# Patient Record
Sex: Male | Born: 1950 | Race: White | Hispanic: No | Smoking: Current every day smoker
Health system: Southern US, Community
[De-identification: ages and names within clinical notes are randomized; demographics above are authoritative.]

## PROBLEM LIST (undated history)

## (undated) DIAGNOSIS — Z789 Other specified health status: Secondary | ICD-10-CM

## (undated) HISTORY — PX: OTHER SURGICAL HISTORY: SHX169

## (undated) HISTORY — PX: KNEE SURGERY: SHX244

## (undated) HISTORY — PX: LUNG SURGERY: SHX703

## (undated) HISTORY — PX: APPENDECTOMY: SHX54

---

## 2000-01-22 ENCOUNTER — Emergency Department (HOSPITAL_COMMUNITY): Admission: EM | Admit: 2000-01-22 | Discharge: 2000-01-22 | Payer: Self-pay | Admitting: Emergency Medicine

## 2000-02-01 ENCOUNTER — Emergency Department (HOSPITAL_COMMUNITY): Admission: EM | Admit: 2000-02-01 | Discharge: 2000-02-01 | Payer: Self-pay

## 2000-09-15 ENCOUNTER — Other Ambulatory Visit: Admission: RE | Admit: 2000-09-15 | Discharge: 2000-09-15 | Payer: Self-pay | Admitting: Orthopedic Surgery

## 2001-12-02 ENCOUNTER — Encounter: Payer: Self-pay | Admitting: Emergency Medicine

## 2001-12-02 ENCOUNTER — Emergency Department (HOSPITAL_COMMUNITY): Admission: AC | Admit: 2001-12-02 | Discharge: 2001-12-03 | Payer: Self-pay

## 2001-12-03 ENCOUNTER — Encounter: Payer: Self-pay | Admitting: Emergency Medicine

## 2011-06-20 ENCOUNTER — Emergency Department (HOSPITAL_COMMUNITY)
Admission: EM | Admit: 2011-06-20 | Discharge: 2011-06-20 | Disposition: A | Discharge status: Home / Self Care | Payer: Worker's Compensation | Attending: Emergency Medicine | Admitting: Emergency Medicine

## 2011-06-20 ENCOUNTER — Encounter (HOSPITAL_COMMUNITY): Payer: Self-pay | Admitting: *Deleted

## 2011-06-20 ENCOUNTER — Emergency Department (HOSPITAL_COMMUNITY): Payer: Worker's Compensation

## 2011-06-20 DIAGNOSIS — M25469 Effusion, unspecified knee: Secondary | ICD-10-CM | POA: Insufficient documentation

## 2011-06-20 DIAGNOSIS — Z9889 Other specified postprocedural states: Secondary | ICD-10-CM | POA: Insufficient documentation

## 2011-06-20 DIAGNOSIS — IMO0002 Reserved for concepts with insufficient information to code with codable children: Secondary | ICD-10-CM | POA: Insufficient documentation

## 2011-06-20 DIAGNOSIS — M25569 Pain in unspecified knee: Secondary | ICD-10-CM | POA: Insufficient documentation

## 2011-06-20 DIAGNOSIS — X500XXA Overexertion from strenuous movement or load, initial encounter: Secondary | ICD-10-CM | POA: Insufficient documentation

## 2011-06-20 DIAGNOSIS — S8391XA Sprain of unspecified site of right knee, initial encounter: Secondary | ICD-10-CM

## 2011-06-20 DIAGNOSIS — Y990 Civilian activity done for income or pay: Secondary | ICD-10-CM | POA: Insufficient documentation

## 2011-06-20 MED ORDER — MELOXICAM 15 MG PO TABS: 15.0000 mg | ORAL_TABLET | Freq: Every day | ORAL | Status: AC

## 2011-06-20 NOTE — ED Notes (Signed)
Patient was working at [employer]. A floor board slipped and patient twisted right knee. Did not fall to floor

## 2011-06-20 NOTE — ED Provider Notes (Signed)
History     CSN: [id number]  Arrival date & time 06/20/11  1025   First MD Initiated Contact with Patient 06/20/11 1039      Chief Complaint  Patient presents with  . Knee Pain    right knee    (Consider location/radiation/quality/duration/timing/severity/associated sxs/prior treatment) Patient is a 61 y.o. male presenting with knee pain. The history is provided by the patient.  Knee Pain This is a new problem. The current episode started today. The problem occurs constantly. Associated symptoms include joint swelling. Pertinent negatives include no chills or fever.  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 State was working today and a board slipped from underneath his foot. States it made his right knee twist. He denies falling or hitting his knee on the ground. States unable to bear weight on that leg now. History of right knee orthoscopic surgeries. Denies weakness or numbness distal to the injury. No other injuries.   History reviewed. No pertinent past medical history.  Past Surgical History  Procedure Date  . Knee surgery     multiple right knee surgeries from prior injury    No family history on file.  History  Substance Use Topics  . Smoking status: Not on file  . Smokeless tobacco: Not on file  . Alcohol Use:       Review of Systems  Constitutional: Negative for fever and chills.  HENT: Negative.   Eyes: Negative.   Respiratory: Negative.   Cardiovascular: Negative.   Gastrointestinal: Negative.   Genitourinary: Negative.   Musculoskeletal: Positive for joint swelling.  Skin: Negative.   Neurological: Negative.   Psychiatric/Behavioral: Negative.     Allergies  Review of patient's allergies indicates not on file.  Home Medications  No current outpatient prescriptions on file.  BP 150/79  Pulse 87  Temp(Src) 98.3 F (36.8 C) (Oral)  Resp 18  Ht 5\' 11"  (1.803 m)  Wt 220 lb (99.791 kg)  BMI 30.68 kg/m2  SpO2 100%  Physical Exam  Nursing  note and vitals reviewed. Constitutional: He is oriented to person, place, and time. He appears well-developed and well-nourished. No distress.  HENT:  Head: Normocephalic.  Neck: Neck supple.  Cardiovascular: Normal rate, regular rhythm and normal heart sounds.   Pulmonary/Chest: Effort normal and breath sounds normal.  Musculoskeletal: Normal range of motion.       Right knee normal appearing with no significant swelling noted. No tenderness with palpation over entire joint. Patella tendon intact. Full ROM of the joint, however, pain with full flexion and full extension. Negative anterior and posterior drawer signs. No laxaty or pain with medial or lateral stress. Positive appley's test. NOrmal right hip and ankle  Neurological: He is alert and oriented to person, place, and time.  Skin: Skin is warm and dry.  Psychiatric: He has a normal mood and affect.    ED Course  Procedures (including critical care time)  No results found for this or any previous visit. Dg Knee Complete 4 Views Right  06/20/2011  *RADIOLOGY REPORT*  Clinical Data: Pain after twisting injury.  RIGHT KNEE - COMPLETE 4+ VIEW  Comparison: None.  Findings: Small marginal spurs about all three compartments of the knee.  There is mild narrowing of the articular cartilage in the medial compartment.  Normal alignment and mineralization.  Negative for fracture, dislocation, or other acute bone injury.  No definite effusion.  IMPRESSION: 1.  Early tricompartmental degenerative changes, most marked medially. 2.  No fracture or other acute  bony abnormality.  Original Report Authenticated By: Osa Craver, M.D.    On the job injury. Right knee appears normal. X-ray just shows arthritis. Joint is stable. Pt however unable to put any weight on that leg. Refusing pain medications. Will apply immobilizer, crutches provided. Will d/c home with orthopedics follow up. Instructed not to put any weight on that leg until seen by  ortho.    No diagnosis found.    MDM          Lottie Mussel, PA 06/20/11 1228

## 2011-06-20 NOTE — Discharge Instructions (Signed)
Your x-ray shows severe arthritis in that right knee. There is no other acute findings. Keep your knee elevated at home, Ice it several times a day. Use crutches and immobilizer when walking around. Start mobic for pain and inflamation as prescribed. Follow up with orthopedics specialist for further evaluation and treatment of your knee. No working, kneeling, squatting, jumping or running until cleared by orthopedics or you are completely pain free.  Knee Sprain You have a knee sprain. Sprains are painful injuries to the joints. A sprain is a partial or complete tearing of ligaments. Ligaments are tough, fibrous tissues that hold bones together at the joints. A strain (sprain) has occurred when a ligament is stretched or damaged. This injury may take several weeks to heal. This is often the same length of time as a bone fracture (break in bone) takes to heal. Even though a fracture (bone break) may not have occurred, the recovery times may be similar. HOME CARE INSTRUCTIONS   Rest the injured area for as long as directed by your caregiver. Then slowly start using the joint as directed by your caregiver and as the pain allows. Use crutches as directed. If the knee was splinted or casted, continue use and care as directed. If an ace bandage has been applied today, it should be removed and reapplied every 3 to 4 hours. It should not be applied tightly, but firmly enough to keep swelling down. Watch toes and feet for swelling, bluish discoloration, coldness, numbness or excessive pain. If any of these symptoms occur, remove the ace bandage and reapply more loosely.If these symptoms persist, seek medical attention.   For the first 24 hours, lie down. Keep the injured extremity elevated on two pillows.   Apply ice to the injured area for 15 to 20 minutes every couple hours. Repeat this 3 to 4 times per day for the first 48 hours. Put the ice in a plastic bag and place a towel between the bag of ice and your  skin.   Wear any splinting, casting, or elastic bandage applications as instructed.   Only take over-the-counter or prescription medicines for pain, discomfort, or fever as directed by your caregiver. Do not use aspirin immediately after the injury unless instructed by your caregiver. Aspirin can cause increased bleeding and bruising of the tissues.   If you were given crutches, continue to use them as instructed. Do not resume weight bearing on the affected extremity until instructed.  Persistent pain and inability to use the injured area as directed for more than 2 to 3 days are warning signs. If this happens you should see a caregiver for a follow-up visit as soon as possible. Initially, a hairline fracture (this is the same as a broken bone) may not be evident on x-rays. Persistent pain and swelling indicate that further evaluation, non-weight bearing (use of crutches as instructed), and/or further x-rays are indicated. X-rays may sometimes not show a small fracture until a week or ten days later. Make a follow-up appointment with your own caregiver or one to whom we have referred you. A radiologist (specialist in reading x-rays) may re-read your X-rays. Make sure you know how you are to get your x-ray results. Do not assume everything is normal if you do not hear from [place]. SEEK MEDICAL CARE IF:   Bruising, swelling, or pain increases.   You have cold or numb toes   You have continuing difficulty or pain with walking.  SEEK IMMEDIATE MEDICAL CARE IF:  Your toes are cold, numb or blue.   The pain is not responding to medications and continues to stay the same or get worse.  MAKE SURE YOU:   Understand these instructions.   Will watch your condition.   Will get help right away if you are not doing well or get worse.  Document Released: 04/19/2005 Document Revised: 12/30/2010 Document Reviewed: 04/03/2007 [facility] Patient Information 2012 [place], [place].

## 2011-06-20 NOTE — ED Notes (Signed)
Awaiting lab to come to draw blood for Wk Comp injury

## 2011-06-21 NOTE — ED Provider Notes (Signed)
Medical screening examination/treatment/procedure(s) were performed by non-physician practitioner and as supervising physician I was immediately available for consultation/collaboration.  Hurman Horn, MD 06/21/11 2158

## 2013-11-22 ENCOUNTER — Emergency Department (HOSPITAL_COMMUNITY)
Admission: EM | Admit: 2013-11-22 | Discharge: 2013-11-23 | Disposition: A | Discharge status: Home / Self Care | Payer: Worker's Compensation | Attending: Emergency Medicine | Admitting: Emergency Medicine

## 2013-11-22 DIAGNOSIS — W06XXXA Fall from bed, initial encounter: Secondary | ICD-10-CM | POA: Insufficient documentation

## 2013-11-22 DIAGNOSIS — Y92009 Unspecified place in unspecified non-institutional (private) residence as the place of occurrence of the external cause: Secondary | ICD-10-CM | POA: Insufficient documentation

## 2013-11-22 DIAGNOSIS — Y9389 Activity, other specified: Secondary | ICD-10-CM | POA: Insufficient documentation

## 2013-11-22 DIAGNOSIS — R29818 Other symptoms and signs involving the nervous system: Secondary | ICD-10-CM | POA: Insufficient documentation

## 2013-11-22 DIAGNOSIS — F17200 Nicotine dependence, unspecified, uncomplicated: Secondary | ICD-10-CM | POA: Insufficient documentation

## 2013-11-22 DIAGNOSIS — W19XXXA Unspecified fall, initial encounter: Secondary | ICD-10-CM

## 2013-11-22 DIAGNOSIS — S0990XA Unspecified injury of head, initial encounter: Secondary | ICD-10-CM | POA: Insufficient documentation

## 2013-11-23 ENCOUNTER — Emergency Department (HOSPITAL_COMMUNITY): Payer: Worker's Compensation

## 2013-11-23 ENCOUNTER — Encounter (HOSPITAL_COMMUNITY): Payer: Self-pay | Admitting: Emergency Medicine

## 2013-11-23 ENCOUNTER — Emergency Department (HOSPITAL_COMMUNITY): Payer: Self-pay

## 2013-11-23 LAB — COMPREHENSIVE METABOLIC PANEL
ALT: 20 U/L (ref 0–53)
ANION GAP: 17 — AB (ref 5–15)
AST: 24 U/L (ref 0–37)
Albumin: 3.7 g/dL (ref 3.5–5.2)
Alkaline Phosphatase: 71 U/L (ref 39–117)
BUN: 4 mg/dL — AB (ref 6–23)
CALCIUM: 8.8 mg/dL (ref 8.4–10.5)
CO2: 22 mEq/L (ref 19–32)
CREATININE: 0.73 mg/dL (ref 0.50–1.35)
Chloride: 98 mEq/L (ref 96–112)
GFR calc non Af Amer: >90 mL/min (ref >90)
GLUCOSE: 91 mg/dL (ref 70–99)
Potassium: 4 mEq/L (ref 3.7–5.3)
SODIUM: 137 meq/L (ref 137–147)
TOTAL PROTEIN: 6.8 g/dL (ref 6.0–8.3)
Total Bilirubin: 0.2 mg/dL — ABNORMAL LOW (ref 0.3–1.2)

## 2013-11-23 LAB — URINALYSIS, ROUTINE W REFLEX MICROSCOPIC
Bilirubin Urine: NEGATIVE
Glucose, UA: NEGATIVE mg/dL
Hgb urine dipstick: NEGATIVE
KETONES UR: NEGATIVE mg/dL
LEUKOCYTES UA: NEGATIVE
NITRITE: NEGATIVE
PROTEIN: NEGATIVE mg/dL
Specific Gravity, Urine: 1.008 (ref 1.005–1.030)
Urobilinogen, UA: 0.2 mg/dL (ref 0.0–1.0)
pH: 5 (ref 5.0–8.0)

## 2013-11-23 LAB — LIPASE, BLOOD: LIPASE: 42 U/L (ref 11–59)

## 2013-11-23 LAB — CBC WITH DIFFERENTIAL/PLATELET
Basophils Absolute: 0 10*3/uL (ref 0.0–0.1)
Basophils Relative: 0 % (ref 0–1)
EOS ABS: 0.2 10*3/uL (ref 0.0–0.7)
EOS PCT: 3 % (ref 0–5)
HCT: 48.8 % (ref 39.0–52.0)
Hemoglobin: 17 g/dL (ref 13.0–17.0)
LYMPHS ABS: 3.7 10*3/uL (ref 0.7–4.0)
Lymphocytes Relative: 39 % (ref 12–46)
MCH: 32 pg (ref 26.0–34.0)
MCHC: 34.8 g/dL (ref 30.0–36.0)
MCV: 91.7 fL (ref 78.0–100.0)
MONOS PCT: 7 % (ref 3–12)
Monocytes Absolute: 0.7 10*3/uL (ref 0.1–1.0)
Neutro Abs: 4.7 10*3/uL (ref 1.7–7.7)
Neutrophils Relative %: 51 % (ref 43–77)
PLATELETS: 173 10*3/uL (ref 150–400)
RBC: 5.32 MIL/uL (ref 4.22–5.81)
RDW: 14.2 % (ref 11.5–15.5)
WBC: 9.3 10*3/uL (ref 4.0–10.5)

## 2013-11-23 LAB — ETHANOL: ALCOHOL ETHYL (B): 159 mg/dL — AB (ref 0–11)

## 2013-11-23 MED ORDER — NAPROXEN 250 MG PO TABS: 500.0000 mg | ORAL_TABLET | Freq: Once | ORAL | Status: DC

## 2013-11-23 NOTE — ED Notes (Signed)
Per EMS: pt coming from home with c/o fall. pt fell around approximately 2230. Pt admitted to having 8 beers today. Pt states he can't move bilateral legs, good CMS, tactile stimulation performed on soles of feet with positive response. Pt reports 2/10 pain in buttocks. Pt A&Ox4, respirations equal and unlabored, skin warm and dry

## 2013-11-23 NOTE — Discharge Instructions (Signed)
Your workup today has not shown any damage to your head neck or back.  Drink in moderation to avoid further falls.  Return to the emergency room for worsening condition or new concerning symptoms.   Fall Prevention and Home Safety Falls cause injuries and can affect all age groups. It is possible to use preventive measures to significantly decrease the likelihood of falls. There are many simple measures which can make your home safer and prevent falls. OUTDOORS  Repair cracks and edges of walkways and driveways.  Remove high doorway thresholds.  Trim shrubbery on the main path into your home.  Have good outside lighting.  Clear walkways of tools, rocks, debris, and clutter.  Check that handrails are not broken and are securely fastened. Both sides of steps should have handrails.  Have leaves, snow, and ice cleared regularly.  Use sand or salt on walkways during winter months.  In the garage, clean up grease or oil spills. BATHROOM  Install night lights.  Install grab bars by the toilet and in the tub and shower.  Use non-skid mats or decals in the tub or shower.  Place a plastic non-slip stool in the shower to sit on, if needed.  Keep floors dry and clean up all water on the floor immediately.  Remove soap buildup in the tub or shower on a regular basis.  Secure bath mats with non-slip, double-sided rug tape.  Remove throw rugs and tripping hazards from the floors. BEDROOMS  Install night lights.  Make sure a bedside light is easy to reach.  Do not use oversized bedding.  Keep a telephone by your bedside.  Have a firm chair with side arms to use for getting dressed.  Remove throw rugs and tripping hazards from the floor. KITCHEN  Keep handles on pots and pans turned toward the center of the stove. Use back burners when possible.  Clean up spills quickly and allow time for drying.  Avoid walking on wet floors.  Avoid hot utensils and knives.  Position  shelves so they are not too high or low.  Place commonly used objects within easy reach.  If necessary, use a sturdy step stool with a grab bar when reaching.  Keep electrical cables out of the way.  Do not use floor polish or wax that makes floors slippery. If you must use wax, use non-skid floor wax.  Remove throw rugs and tripping hazards from the floor. STAIRWAYS  Never leave objects on stairs.  Place handrails on both sides of stairways and use them. Fix any loose handrails. Make sure handrails on both sides of the stairways are as long as the stairs.  Check carpeting to make sure it is firmly attached along stairs. Make repairs to worn or loose carpet promptly.  Avoid placing throw rugs at the top or bottom of stairways, or properly secure the rug with carpet tape to prevent slippage. Get rid of throw rugs, if possible.  Have an electrician put in a light switch at the top and bottom of the stairs. OTHER FALL PREVENTION TIPS  Wear low-heel or rubber-soled shoes that are supportive and fit well. Wear closed toe shoes.  When using a stepladder, make sure it is fully opened and both spreaders are firmly locked. Do not climb a closed stepladder.  Add color or contrast paint or tape to grab bars and handrails in your home. Place contrasting color strips on first and last steps.  Learn and use mobility aids as needed. Install an  electrical emergency response system.  Turn on lights to avoid dark areas. Replace light bulbs that burn out immediately. Get light switches that glow.  Arrange furniture to create clear pathways. Keep furniture in the same place.  Firmly attach carpet with non-skid or double-sided tape.  Eliminate uneven floor surfaces.  Select a carpet pattern that does not visually hide the edge of steps.  Be aware of all pets. OTHER HOME SAFETY TIPS  Set the water temperature for 120 F (48.8 C).  Keep emergency numbers on or near the telephone.  Keep  smoke detectors on every level of the home and near sleeping areas. Document Released: 04/09/2002 Document Revised: 10/19/2011 Document Reviewed: 07/09/2011 [facility] Patient Information 2015 [place], [place]. This information is not intended to replace advice given to you by your health care provider. Make sure you discuss any questions you have with your health care provider.

## 2013-11-23 NOTE — ED Notes (Signed)
Pt removed leeds and BP cuff and walked to the bathroom with no assistance. Pt states he is almost ready to leave.
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
## 2013-11-23 NOTE — ED Notes (Signed)
Doctor is at bedside.

## 2016-04-14 IMAGING — CT CT HEAD W/O CM
2 of 6 series · 11 of 47 positions shown, 13 images · non-contrast
Comparison: None.

CLINICAL DATA: Status post fall. Cannot move legs. Concern for head
or cervical spine injury.

EXAM:
CT HEAD WITHOUT CONTRAST
CT CERVICAL SPINE WITHOUT CONTRAST
TECHNIQUE: Multidetector CT imaging of the head and cervical spine was
performed following the standard protocol without intravenous
contrast. Multiplanar CT image reconstructions of the cervical spine
were also generated.

[Series 307: cor · coronal · 0.39mm/px · 3 of 43 slices shown]
[im 15/43  brain]
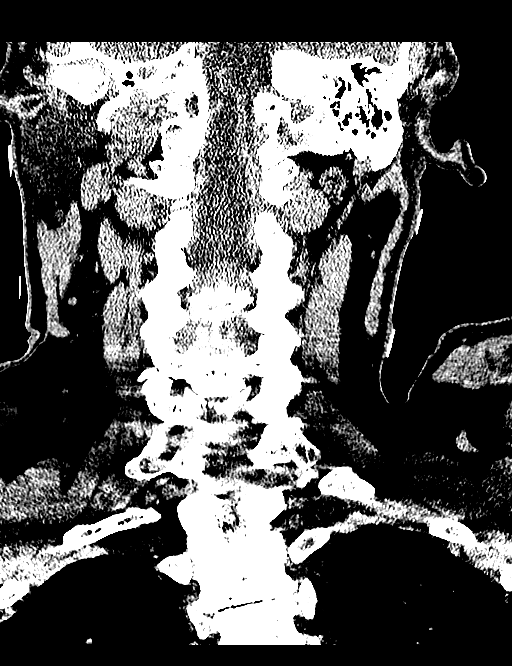
[im 19/43  brain]
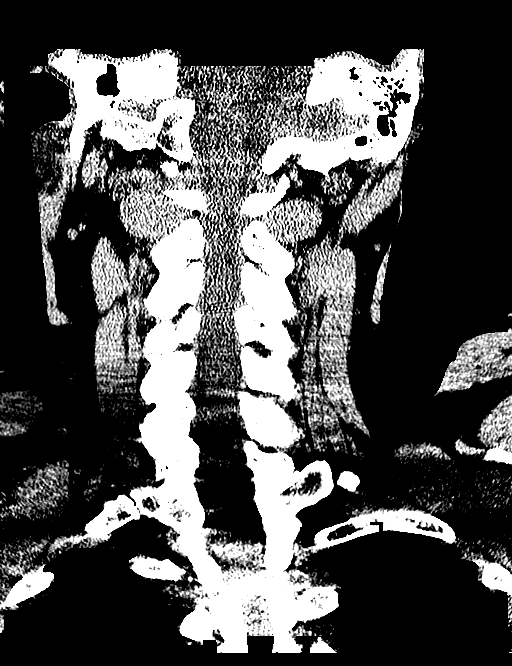
[im 24/43  brain]
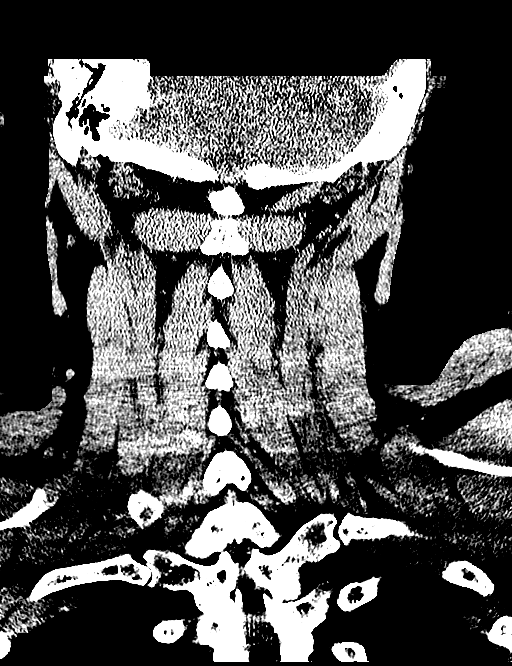

[Series 308: orthog · axial · 0.39mm/px · z∈[-15,+139]mm · 8 of 105 slices shown, 10 images]
[im 12/105  brain]
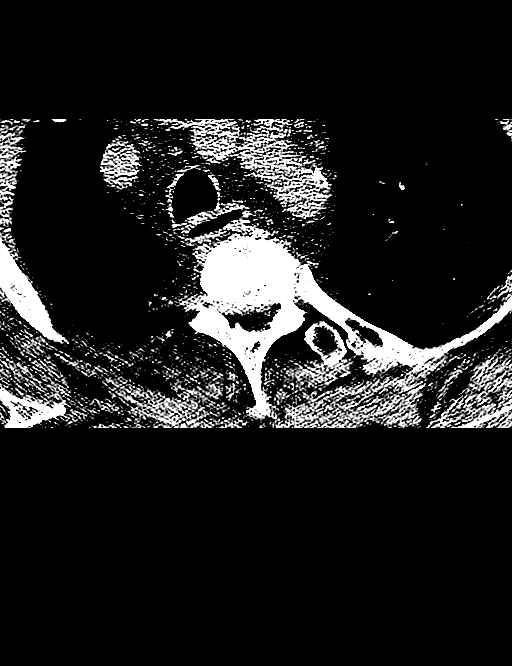
[im 12/105  bone]
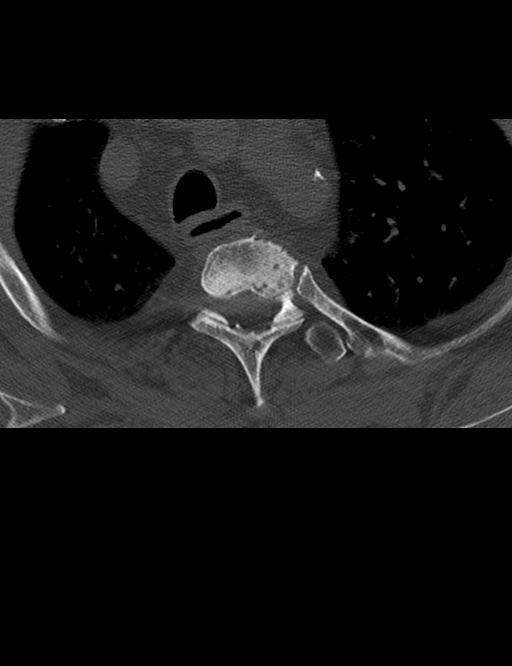
[im 24/105  brain]
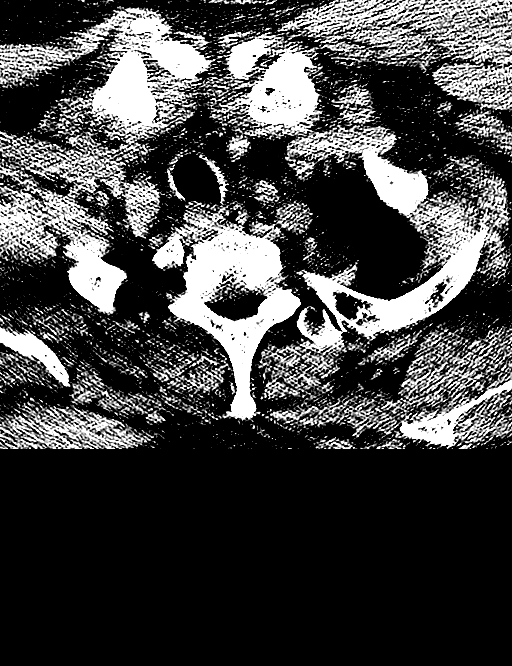
[im 35/105  brain]
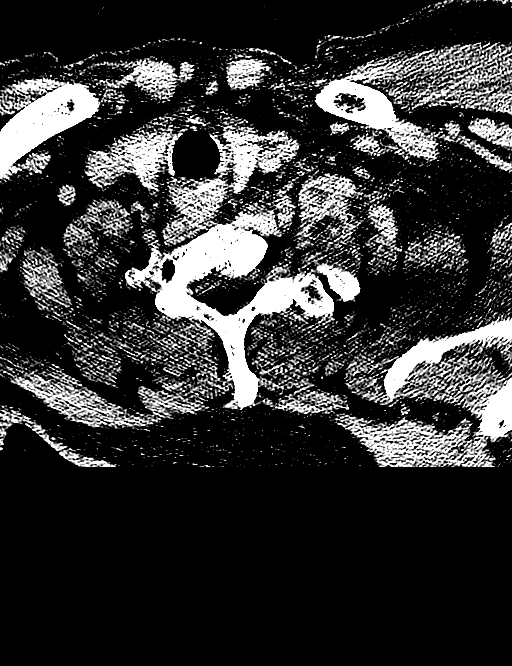
[im 47/105  brain]
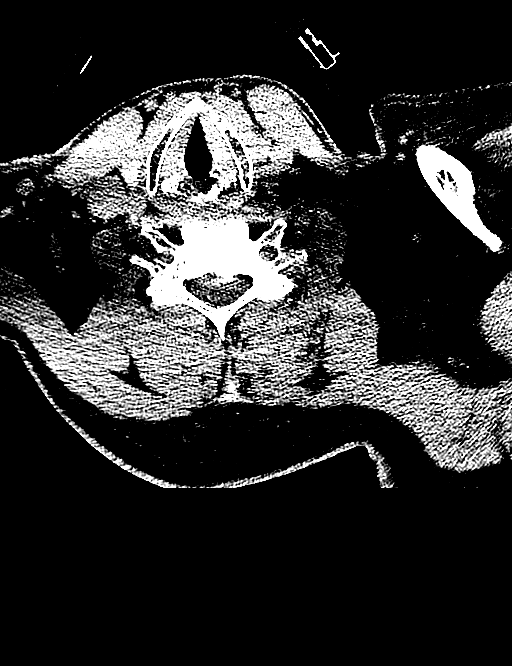
[im 58/105  brain]
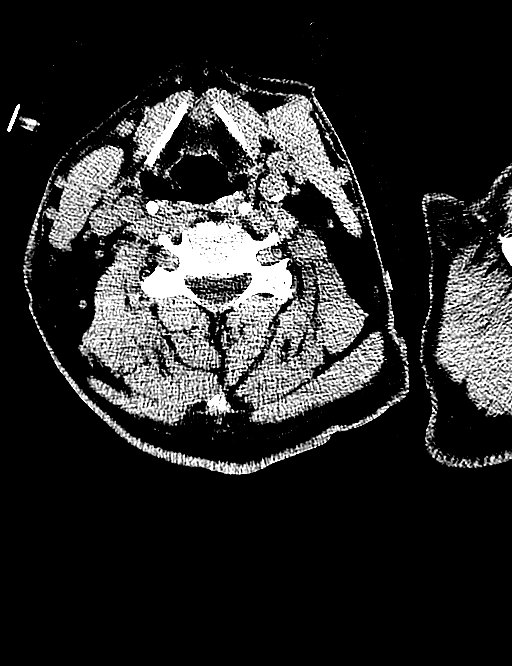
[im 58/105  bone]
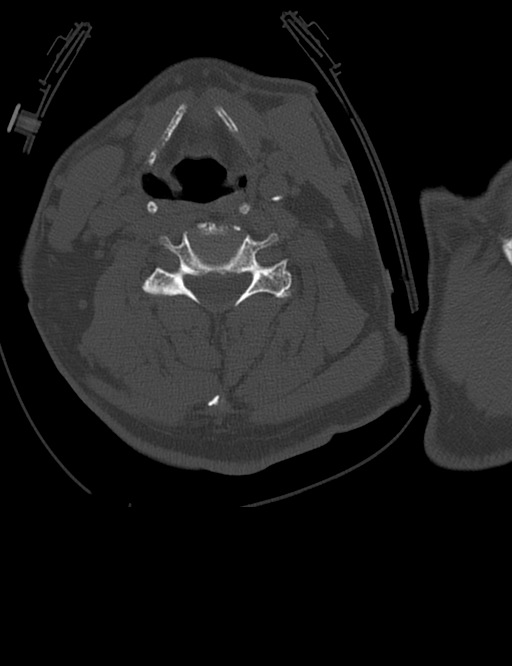
[im 70/105  brain]
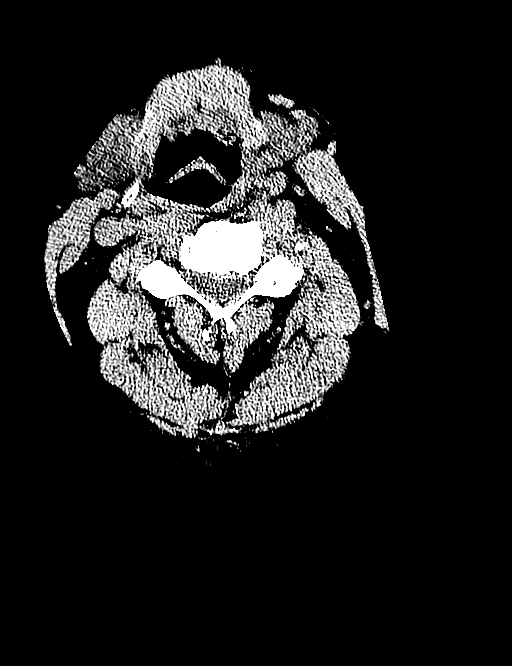
[im 81/105  brain]
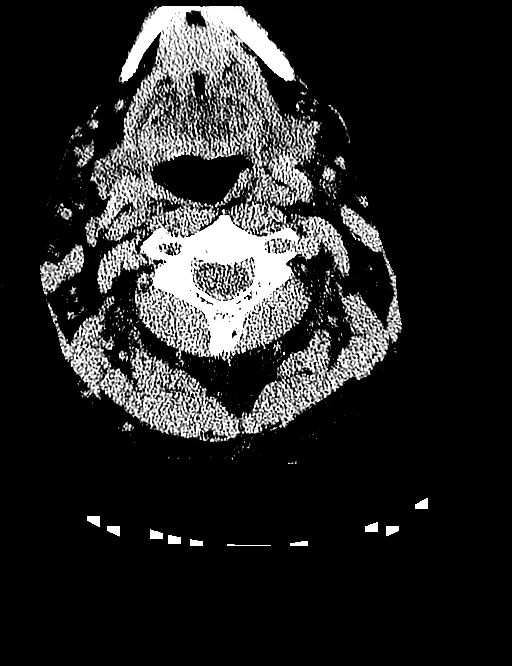
[im 93/105  brain]
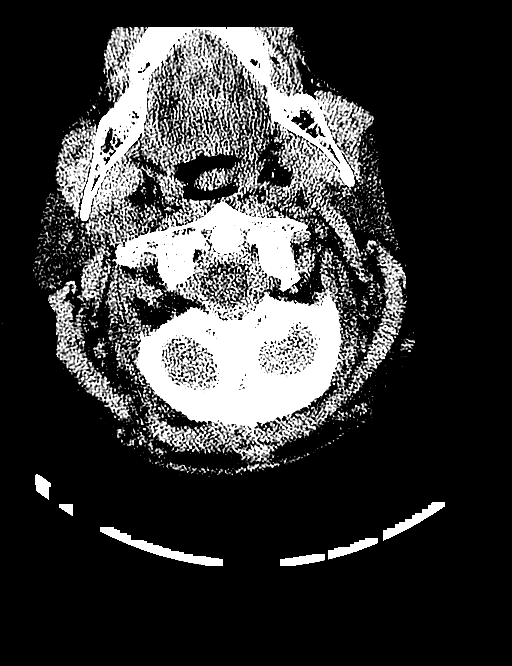

[11 of 47 positions shown; findings below may reference images not displayed]

FINDINGS: CT HEAD FINDINGS

There is no evidence of acute infarction, mass lesion, or intra- or
extra-axial hemorrhage on CT.

Prominence of the ventricles and sulci suggests mild cortical volume
loss. Mild cerebellar atrophy is noted.

The brainstem and fourth ventricle are within normal limits. The
basal ganglia are unremarkable in appearance. The cerebral
hemispheres demonstrate grossly normal gray-white differentiation.
No mass effect or midline shift is seen.

There is no evidence of fracture; visualized osseous structures are
unremarkable in appearance. The orbits are within normal limits. A
small mucus retention cyst or polyp is noted within the left
maxillary sinus. The remaining paranasal sinuses and mastoid air
cells are well-aerated. No significant soft tissue abnormalities are
seen.

CT CERVICAL SPINE FINDINGS

There is no evidence of fracture or subluxation. Vertebral bodies
demonstrate normal height and alignment. Scattered anterior and
posterior disc osteophyte complexes are seen along the cervical
spine, with narrowing of the intervertebral disc spaces at C3-C4 and
C5-C6. There is also narrowing of the intervertebral disc space at
T2-T3, with associated sclerotic change and vacuum phenomenon.
Prevertebral soft tissues are within normal limits.

There is loosening of the visualized remaining mandibular teeth. The
thyroid gland is unremarkable in appearance. Minimal atelectasis is
noted at the lung apices. No significant soft tissue abnormalities
are seen.
IMPRESSION: 1. No evidence of traumatic intracranial injury or fracture.
2. No evidence of fracture or subluxation along the cervical spine.
3. Mild cortical volume loss noted.
4. Minimal degenerative change noted along the cervical spine.
5. Small mucus retention cyst or polyp at the left maxillary sinus.
6. Minimal atelectasis at the lung apices.

## 2018-11-02 ENCOUNTER — Emergency Department: Payer: Medicare HMO

## 2018-11-02 ENCOUNTER — Inpatient Hospital Stay
Admission: EM | Admit: 2018-11-02 | Discharge: 2018-11-05 | DRG: 871 | Disposition: A | Discharge status: Home / Self Care | Payer: Medicare HMO | Attending: Internal Medicine | Admitting: Internal Medicine

## 2018-11-02 ENCOUNTER — Other Ambulatory Visit: Payer: Self-pay

## 2018-11-02 DIAGNOSIS — J4541 Moderate persistent asthma with (acute) exacerbation: Secondary | ICD-10-CM | POA: Diagnosis not present

## 2018-11-02 DIAGNOSIS — R0602 Shortness of breath: Secondary | ICD-10-CM | POA: Diagnosis not present

## 2018-11-02 DIAGNOSIS — J189 Pneumonia, unspecified organism: Secondary | ICD-10-CM | POA: Diagnosis present

## 2018-11-02 DIAGNOSIS — R000 Tachycardia, unspecified: Secondary | ICD-10-CM | POA: Diagnosis not present

## 2018-11-02 DIAGNOSIS — Z716 Tobacco abuse counseling: Secondary | ICD-10-CM | POA: Diagnosis not present

## 2018-11-02 DIAGNOSIS — R0902 Hypoxemia: Secondary | ICD-10-CM | POA: Diagnosis not present

## 2018-11-02 DIAGNOSIS — A419 Sepsis, unspecified organism: Secondary | ICD-10-CM | POA: Diagnosis not present

## 2018-11-02 DIAGNOSIS — J441 Chronic obstructive pulmonary disease with (acute) exacerbation: Secondary | ICD-10-CM | POA: Diagnosis present

## 2018-11-02 DIAGNOSIS — R109 Unspecified abdominal pain: Secondary | ICD-10-CM | POA: Diagnosis not present

## 2018-11-02 DIAGNOSIS — R0689 Other abnormalities of breathing: Secondary | ICD-10-CM | POA: Diagnosis not present

## 2018-11-02 DIAGNOSIS — K5732 Diverticulitis of large intestine without perforation or abscess without bleeding: Secondary | ICD-10-CM | POA: Diagnosis not present

## 2018-11-02 DIAGNOSIS — F17210 Nicotine dependence, cigarettes, uncomplicated: Secondary | ICD-10-CM | POA: Diagnosis not present

## 2018-11-02 DIAGNOSIS — Z6837 Body mass index (BMI) 37.0-37.9, adult: Secondary | ICD-10-CM | POA: Diagnosis not present

## 2018-11-02 DIAGNOSIS — K5792 Diverticulitis of intestine, part unspecified, without perforation or abscess without bleeding: Secondary | ICD-10-CM

## 2018-11-02 DIAGNOSIS — Z20828 Contact with and (suspected) exposure to other viral communicable diseases: Secondary | ICD-10-CM | POA: Diagnosis present

## 2018-11-02 DIAGNOSIS — Z209 Contact with and (suspected) exposure to unspecified communicable disease: Secondary | ICD-10-CM | POA: Diagnosis not present

## 2018-11-02 HISTORY — DX: Other specified health status: Z78.9

## 2018-11-02 LAB — COMPREHENSIVE METABOLIC PANEL
ALT: 40 U/L (ref 0–44)
AST: 42 U/L — ABNORMAL HIGH (ref 15–41)
Albumin: 3.7 g/dL (ref 3.5–5.0)
Alkaline Phosphatase: 78 U/L (ref 38–126)
Anion gap: 10 (ref 5–15)
BUN: 9 mg/dL (ref 8–23)
CO2: 21 mmol/L — ABNORMAL LOW (ref 22–32)
Calcium: 8.5 mg/dL — ABNORMAL LOW (ref 8.9–10.3)
Chloride: 100 mmol/L (ref 98–111)
Creatinine, Ser: 1.03 mg/dL (ref 0.61–1.24)
GFR calc Af Amer: >60 mL/min (ref >60)
GFR calc non Af Amer: >60 mL/min (ref >60)
Glucose, Bld: 146 mg/dL — ABNORMAL HIGH (ref 70–99)
Potassium: 3.6 mmol/L (ref 3.5–5.1)
Sodium: 131 mmol/L — ABNORMAL LOW (ref 135–145)
Total Bilirubin: 1.4 mg/dL — ABNORMAL HIGH (ref 0.3–1.2)
Total Protein: 7.2 g/dL (ref 6.5–8.1)

## 2018-11-02 LAB — CBC WITH DIFFERENTIAL/PLATELET
Abs Immature Granulocytes: 0.11 10*3/uL — ABNORMAL HIGH (ref 0.00–0.07)
Basophils Absolute: 0.1 10*3/uL (ref 0.0–0.1)
Basophils Relative: 1 %
Eosinophils Absolute: 0 10*3/uL (ref 0.0–0.5)
Eosinophils Relative: 0 %
HCT: 50.9 % (ref 39.0–52.0)
Hemoglobin: 17.5 g/dL — ABNORMAL HIGH (ref 13.0–17.0)
Immature Granulocytes: 1 %
Lymphocytes Relative: 8 %
Lymphs Abs: 1.2 10*3/uL (ref 0.7–4.0)
MCH: 30.8 pg (ref 26.0–34.0)
MCHC: 34.4 g/dL (ref 30.0–36.0)
MCV: 89.6 fL (ref 80.0–100.0)
Monocytes Absolute: 1 10*3/uL (ref 0.1–1.0)
Monocytes Relative: 7 %
Neutro Abs: 12.4 10*3/uL — ABNORMAL HIGH (ref 1.7–7.7)
Neutrophils Relative %: 83 %
Platelets: 146 10*3/uL — ABNORMAL LOW (ref 150–400)
RBC: 5.68 MIL/uL (ref 4.22–5.81)
RDW: 14 % (ref 11.5–15.5)
WBC: 14.8 10*3/uL — ABNORMAL HIGH (ref 4.0–10.5)
nRBC: 0 % (ref 0.0–0.2)

## 2018-11-02 LAB — LACTIC ACID, PLASMA: Lactic Acid, Venous: 1.3 mmol/L (ref 0.5–1.9)

## 2018-11-02 LAB — PROTIME-INR
INR: 1.1 (ref 0.8–1.2)
Prothrombin Time: 13.8 seconds (ref 11.4–15.2)

## 2018-11-02 LAB — SARS CORONAVIRUS 2 BY RT PCR (HOSPITAL ORDER, PERFORMED IN ~~LOC~~ HOSPITAL LAB): SARS Coronavirus 2: NEGATIVE

## 2018-11-02 LAB — LIPASE, BLOOD: Lipase: 23 U/L (ref 11–51)

## 2018-11-02 MED ORDER — IOHEXOL 300 MG/ML  SOLN
100.0000 mL | Freq: Once | INTRAMUSCULAR | Status: AC | PRN
  Administered 2018-11-02: 100 mL via INTRAVENOUS

## 2018-11-02 MED ORDER — METHYLPREDNISOLONE SODIUM SUCC 125 MG IJ SOLR
125.0000 mg | INTRAMUSCULAR | Status: AC
  Administered 2018-11-02: 125 mg via INTRAVENOUS
  Filled 2018-11-02: qty 2

## 2018-11-02 MED ORDER — SODIUM CHLORIDE 0.9 % IV SOLN
2.0000 g | Freq: Once | INTRAVENOUS | Status: AC
  Administered 2018-11-02: 2 g via INTRAVENOUS
  Filled 2018-11-02: qty 2

## 2018-11-02 MED ORDER — SODIUM CHLORIDE 0.9 % IV BOLUS
1000.0000 mL | Freq: Once | INTRAVENOUS | Status: AC
  Administered 2018-11-02: 22:00:00 1000 mL via INTRAVENOUS

## 2018-11-02 MED ORDER — IBUPROFEN 600 MG PO TABS
600.0000 mg | ORAL_TABLET | ORAL | Status: AC
  Administered 2018-11-02: 600 mg via ORAL
  Filled 2018-11-02: qty 1

## 2018-11-02 MED ORDER — ALBUTEROL SULFATE HFA 108 (90 BASE) MCG/ACT IN AERS
4.0000 | INHALATION_SPRAY | Freq: Once | RESPIRATORY_TRACT | Status: AC
  Administered 2018-11-02: 4 via RESPIRATORY_TRACT
  Filled 2018-11-02: qty 6.7

## 2018-11-02 MED ORDER — VANCOMYCIN HCL 10 G IV SOLR
2500.0000 mg | Freq: Once | INTRAVENOUS | Status: AC
  Administered 2018-11-02: 2500 mg via INTRAVENOUS
  Filled 2018-11-02: qty 2500

## 2018-11-02 NOTE — Progress Notes (Signed)
CODE SEPSIS - PHARMACY COMMUNICATION  **Broad Spectrum Antibiotics should be administered within 1 hour of Sepsis diagnosis**  Time Code Sepsis Called/Page Received:   7/2 @ 20:32   Antibiotics Ordered:   Vanc, Cefepime   Time of 1st antibiotic administration:   7/2 @ 2134   Additional action taken by pharmacy:   If necessary, Name of Provider/Nurse Contacted:     Arisa Congleton D ,PharmD Clinical Pharmacist  11/02/2018  9:48 PM

## 2018-11-02 NOTE — ED Triage Notes (Signed)
Pt to ED via EMS from home. Per ems pt had had sob, fever, weakness, chills and diarrhea for 2 days. Pt arrives on 4L at 96%. Pt has temp of 100.6 Per ems pt was 83-86 room air on arrival. Pt was given 1g tylenol and 500 fluids by ems
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
## 2018-11-03 ENCOUNTER — Other Ambulatory Visit: Payer: Self-pay

## 2018-11-03 DIAGNOSIS — J441 Chronic obstructive pulmonary disease with (acute) exacerbation: Secondary | ICD-10-CM | POA: Diagnosis present

## 2018-11-03 DIAGNOSIS — F17210 Nicotine dependence, cigarettes, uncomplicated: Secondary | ICD-10-CM | POA: Diagnosis present

## 2018-11-03 DIAGNOSIS — Z20828 Contact with and (suspected) exposure to other viral communicable diseases: Secondary | ICD-10-CM | POA: Diagnosis present

## 2018-11-03 DIAGNOSIS — Z716 Tobacco abuse counseling: Secondary | ICD-10-CM | POA: Diagnosis not present

## 2018-11-03 DIAGNOSIS — A419 Sepsis, unspecified organism: Secondary | ICD-10-CM | POA: Diagnosis present

## 2018-11-03 DIAGNOSIS — Z6837 Body mass index (BMI) 37.0-37.9, adult: Secondary | ICD-10-CM | POA: Diagnosis not present

## 2018-11-03 DIAGNOSIS — J189 Pneumonia, unspecified organism: Secondary | ICD-10-CM | POA: Diagnosis present

## 2018-11-03 DIAGNOSIS — K5732 Diverticulitis of large intestine without perforation or abscess without bleeding: Secondary | ICD-10-CM | POA: Diagnosis present

## 2018-11-03 LAB — BASIC METABOLIC PANEL
Anion gap: 10 (ref 5–15)
BUN: 10 mg/dL (ref 8–23)
CO2: 19 mmol/L — ABNORMAL LOW (ref 22–32)
Calcium: 8.1 mg/dL — ABNORMAL LOW (ref 8.9–10.3)
Chloride: 104 mmol/L (ref 98–111)
Creatinine, Ser: 0.85 mg/dL (ref 0.61–1.24)
GFR calc Af Amer: >60 mL/min (ref >60)
GFR calc non Af Amer: >60 mL/min (ref >60)
Glucose, Bld: 172 mg/dL — ABNORMAL HIGH (ref 70–99)
Potassium: 4.2 mmol/L (ref 3.5–5.1)
Sodium: 133 mmol/L — ABNORMAL LOW (ref 135–145)

## 2018-11-03 LAB — CBC
HCT: 51.3 % (ref 39.0–52.0)
Hemoglobin: 17 g/dL (ref 13.0–17.0)
MCH: 30.7 pg (ref 26.0–34.0)
MCHC: 33.1 g/dL (ref 30.0–36.0)
MCV: 92.8 fL (ref 80.0–100.0)
Platelets: 151 10*3/uL (ref 150–400)
RBC: 5.53 MIL/uL (ref 4.22–5.81)
RDW: 14.1 % (ref 11.5–15.5)
WBC: 10.1 10*3/uL (ref 4.0–10.5)
nRBC: 0 % (ref 0.0–0.2)

## 2018-11-03 LAB — CORTISOL-AM, BLOOD: Cortisol - AM: 9.1 ug/dL (ref 6.7–22.6)

## 2018-11-03 LAB — PROTIME-INR
INR: 1.1 (ref 0.8–1.2)
Prothrombin Time: 14 seconds (ref 11.4–15.2)

## 2018-11-03 LAB — MRSA PCR SCREENING: MRSA by PCR: NEGATIVE

## 2018-11-03 LAB — PROCALCITONIN: Procalcitonin: 1.09 ng/mL

## 2018-11-03 MED ORDER — METHYLPREDNISOLONE SODIUM SUCC 40 MG IJ SOLR
40.0000 mg | Freq: Four times a day (QID) | INTRAMUSCULAR | Status: DC
  Administered 2018-11-03 – 2018-11-04 (×6): 40 mg via INTRAVENOUS
  Filled 2018-11-03 (×6): qty 1

## 2018-11-03 MED ORDER — ONDANSETRON HCL 4 MG/2ML IJ SOLN: 4.0000 mg | Freq: Four times a day (QID) | INTRAMUSCULAR | Status: DC | PRN

## 2018-11-03 MED ORDER — SODIUM CHLORIDE 0.9 % IV SOLN
500.0000 mg | INTRAVENOUS | Status: DC
  Administered 2018-11-03: 500 mg via INTRAVENOUS
  Filled 2018-11-03: qty 500

## 2018-11-03 MED ORDER — ONDANSETRON HCL 4 MG PO TABS: 4.0000 mg | ORAL_TABLET | Freq: Four times a day (QID) | ORAL | Status: DC | PRN

## 2018-11-03 MED ORDER — ACETAMINOPHEN 650 MG RE SUPP: 650.0000 mg | Freq: Four times a day (QID) | RECTAL | Status: DC | PRN

## 2018-11-03 MED ORDER — IPRATROPIUM-ALBUTEROL 0.5-2.5 (3) MG/3ML IN SOLN
3.0000 mL | Freq: Four times a day (QID) | RESPIRATORY_TRACT | Status: DC | PRN
  Administered 2018-11-04 – 2018-11-05 (×2): 3 mL via RESPIRATORY_TRACT
  Filled 2018-11-03 (×2): qty 3

## 2018-11-03 MED ORDER — PIPERACILLIN-TAZOBACTAM 3.375 G IVPB
3.3750 g | Freq: Three times a day (TID) | INTRAVENOUS | Status: DC
  Administered 2018-11-03 – 2018-11-05 (×7): 3.375 g via INTRAVENOUS
  Filled 2018-11-03 (×7): qty 50

## 2018-11-03 MED ORDER — ADULT MULTIVITAMIN W/MINERALS CH
1.0000 | ORAL_TABLET | Freq: Every day | ORAL | Status: DC
  Administered 2018-11-03 – 2018-11-05 (×3): 1 via ORAL
  Filled 2018-11-03 (×3): qty 1

## 2018-11-03 MED ORDER — SODIUM CHLORIDE 0.9 % IV SOLN
INTRAVENOUS | Status: DC
  Administered 2018-11-03 – 2018-11-04 (×3): via INTRAVENOUS

## 2018-11-03 MED ORDER — SODIUM CHLORIDE 0.9 % IV SOLN
2.0000 g | INTRAVENOUS | Status: DC
  Filled 2018-11-03: qty 20

## 2018-11-03 MED ORDER — ACETAMINOPHEN 325 MG PO TABS: 650.0000 mg | ORAL_TABLET | Freq: Four times a day (QID) | ORAL | Status: DC | PRN

## 2018-11-03 MED ORDER — ENOXAPARIN SODIUM 40 MG/0.4ML ~~LOC~~ SOLN
40.0000 mg | SUBCUTANEOUS | Status: DC
  Administered 2018-11-03 – 2018-11-05 (×3): 40 mg via SUBCUTANEOUS
  Filled 2018-11-03 (×3): qty 0.4

## 2018-11-03 MED ORDER — VANCOMYCIN HCL IN DEXTROSE 1-5 GM/200ML-% IV SOLN
1000.0000 mg | Freq: Two times a day (BID) | INTRAVENOUS | Status: DC
  Administered 2018-11-03: 1000 mg via INTRAVENOUS
  Filled 2018-11-03 (×2): qty 200

## 2018-11-03 MED ORDER — SODIUM CHLORIDE 0.9% FLUSH
3.0000 mL | Freq: Two times a day (BID) | INTRAVENOUS | Status: DC
  Administered 2018-11-03 – 2018-11-04 (×4): 3 mL via INTRAVENOUS

## 2018-11-03 MED ORDER — PIPERACILLIN-TAZOBACTAM 3.375 G IVPB: 3.3750 g | Freq: Four times a day (QID) | INTRAVENOUS | Status: DC

## 2018-11-03 MED ORDER — METRONIDAZOLE IN NACL 5-0.79 MG/ML-% IV SOLN
500.0000 mg | Freq: Three times a day (TID) | INTRAVENOUS | Status: DC
  Filled 2018-11-03 (×2): qty 100

## 2018-11-03 NOTE — Progress Notes (Signed)
[place] [facility]                                                                                                                                                                                  Patient Demographics   Matthew Flowers, is a 68 y.o. male, DOB - 1951/01/13, NGE:[id number]  Admit date - 11/02/2018   Admitting Physician Christel Mormon, MD  Outpatient Primary MD for the patient is Patient, No Pcp Per   LOS - 0  Subjective: Patient states that he is feeling better he states that he has no other history of any other medical problems. Abdominal pain is improved   Review of Systems:   CONSTITUTIONAL: No documented fever. No fatigue, weakness. No weight gain, no weight loss.  EYES: No blurry or double vision.  ENT: No tinnitus. No postnasal drip. No redness of the oropharynx.  RESPIRATORY: No cough, no wheeze, no hemoptysis.  Positive dyspnea.  CARDIOVASCULAR: No chest pain. No orthopnea. No palpitations. No syncope.  GASTROINTESTINAL: No nausea, no vomiting or diarrhea.  Improved abdominal pain. No melena or hematochezia.  GENITOURINARY: No dysuria or hematuria.  ENDOCRINE: No polyuria or nocturia. No heat or cold intolerance.  HEMATOLOGY: No anemia. No bruising. No bleeding.  INTEGUMENTARY: No rashes. No lesions.  MUSCULOSKELETAL: No arthritis. No swelling. No gout.  NEUROLOGIC: No numbness, tingling, or ataxia. No seizure-type activity.  PSYCHIATRIC: No anxiety. No insomnia. No ADD.    Vitals:   Vitals:   11/03/18 0130 11/03/18 0200 11/03/18 0242 11/03/18 0748  BP: (!) 166/74 140/76 (!) 159/82 135/89  Pulse: 74 76 74 69  Resp:    19  Temp:   97.6 F (36.4 C) (!) 97.4 F (36.3 C)  TempSrc:   Oral Oral  SpO2: 98% 94% 99% 99%  Weight:   118.5 kg   Height:   5\' 11"  (1.803 m)     Wt Readings from Last 3 Encounters:  11/03/18 118.5 kg  06/20/11 99.8 kg     Intake/Output Summary (Last 24 hours) at 11/03/2018 1406 Last data filed at  11/03/2018 4010 Gross per 24 hour  Intake 660.82 ml  Output 650 ml  Net 10.82 ml    Physical Exam:   GENERAL: Pleasant-appearing in no apparent distress.  HEAD, EYES, EARS, NOSE AND THROAT: Atraumatic, normocephalic. Extraocular muscles are intact. Pupils equal and reactive to light. Sclerae anicteric. No conjunctival injection. No oro-pharyngeal erythema.  NECK: Supple. There is no jugular venous distention. No bruits, no lymphadenopathy, no thyromegaly.  HEART: Regular rate and rhythm,. No murmurs, no rubs, no clicks.  LUNGS: Diminished breath sounds bilaterally ABDOMEN: Soft, flat, nontender, nondistended. Has good  bowel sounds. No hepatosplenomegaly appreciated.  EXTREMITIES: No evidence of any cyanosis, clubbing, or peripheral edema.  +2 pedal and radial pulses bilaterally.  NEUROLOGIC: The patient is alert, awake, and oriented x3 with no focal motor or sensory deficits appreciated bilaterally.  SKIN: Moist and warm with no rashes appreciated.  Psych: Not anxious, depressed LN: No inguinal LN enlargement    Antibiotics   Anti-infectives (From admission, onward)   Start     Dose/Rate Route Frequency Ordered Stop   11/03/18 1000  vancomycin (VANCOCIN) IVPB 1000 mg/200 mL premix  Status:  Discontinued     1,000 mg 200 mL/hr over 60 Minutes Intravenous Every 12 hours 11/03/18 0658 11/03/18 1348   11/03/18 0600  piperacillin-tazobactam (ZOSYN) IVPB 3.375 g  Status:  Discontinued     3.375 g 12.5 mL/hr over 240 Minutes Intravenous Every 6 hours 11/03/18 0419 11/03/18 0424   11/03/18 0500  cefTRIAXone (ROCEPHIN) 2 g in sodium chloride 0.9 % 100 mL IVPB  Status:  Discontinued     2 g 200 mL/hr over 30 Minutes Intravenous Every 24 hours 11/03/18 0139 11/03/18 0419   11/03/18 0430  piperacillin-tazobactam (ZOSYN) IVPB 3.375 g     3.375 g 12.5 mL/hr over 240 Minutes Intravenous Every 8 hours 11/03/18 0424     11/03/18 0315  azithromycin (ZITHROMAX) 500 mg in sodium chloride 0.9 % 250 mL  IVPB  Status:  Discontinued     500 mg 250 mL/hr over 60 Minutes Intravenous Every 24 hours 11/03/18 0139 11/03/18 0419   11/03/18 0315  metroNIDAZOLE (FLAGYL) IVPB 500 mg  Status:  Discontinued     500 mg 100 mL/hr over 60 Minutes Intravenous Every 8 hours 11/03/18 0139 11/03/18 0424   11/02/18 2130  vancomycin (VANCOCIN) 2,500 mg in sodium chloride 0.9 % 500 mL IVPB     2,500 mg 250 mL/hr over 120 Minutes Intravenous  Once 11/02/18 2127 11/03/18 0048   11/02/18 2130  ceFEPIme (MAXIPIME) 2 g in sodium chloride 0.9 % 100 mL IVPB     2 g 200 mL/hr over 30 Minutes Intravenous  Once 11/02/18 2128 11/02/18 2222      Medications   Scheduled Meds: . enoxaparin (LOVENOX) injection  40 mg Subcutaneous Q24H  . methylPREDNISolone (SOLU-MEDROL) injection  40 mg Intravenous Q6H  . multivitamin with minerals  1 tablet Oral Daily  . sodium chloride flush  3 mL Intravenous Q12H   Continuous Infusions: . sodium chloride 125 mL/hr at 11/03/18 0311  . piperacillin-tazobactam (ZOSYN)  IV 3.375 g (11/03/18 0548)   PRN Meds:.acetaminophen **OR** acetaminophen, ipratropium-albuterol, ondansetron **OR** ondansetron (ZOFRAN) IV   Data Review:   Micro Results Recent Results (from the past 240 hour(s))  Blood Culture (routine x 2)     Status: None (Preliminary result)   Collection Time: 11/02/18  8:57 PM   Specimen: BLOOD  Result Value Ref Range Status   Specimen Description BLOOD BLOOD RIGHT HAND  Final   Special Requests   Final    BOTTLES DRAWN AEROBIC AND ANAEROBIC Blood Culture adequate volume   Culture   Final    NO GROWTH < 12 HOURS Performed at [facility], [address] [place], [place] [address]    Report Status PENDING  Incomplete  Blood Culture (routine x 2)     Status: None (Preliminary result)   Collection Time: 11/02/18  8:57 PM   Specimen: BLOOD  Result Value Ref Range Status   Specimen Description BLOOD RIGHT ANTECUBITAL  Final  Special Requests   Final     BOTTLES DRAWN AEROBIC AND ANAEROBIC Blood Culture results may not be optimal due to an excessive volume of blood received in culture bottles   Culture   Final    NO GROWTH < 12 HOURS Performed at [facility], [address] [place], [place] [address]    Report Status PENDING  Incomplete  SARS Coronavirus 2 (CEPHEID- Performed in [facility] hospital lab), Hosp Order     Status: None   Collection Time: 11/02/18  8:57 PM   Specimen: Nasopharyngeal Swab  Result Value Ref Range Status   SARS Coronavirus 2 NEGATIVE NEGATIVE Final    Comment: (NOTE) If result is NEGATIVE SARS-CoV-2 target nucleic acids are NOT DETECTED. The SARS-CoV-2 RNA is generally detectable in upper and lower  respiratory specimens during the acute phase of infection. The lowest  concentration of SARS-CoV-2 viral copies this assay can detect is 250  copies / mL. A negative result does not preclude SARS-CoV-2 infection  and should not be used as the sole basis for treatment or other  patient management decisions.  A negative result may occur with  improper specimen collection / handling, submission of specimen other  than nasopharyngeal swab, presence of viral mutation(s) within the  areas targeted by this assay, and inadequate number of viral copies  (<250 copies / mL). A negative result must be combined with clinical  observations, patient history, and epidemiological information. If result is POSITIVE SARS-CoV-2 target nucleic acids are DETECTED. The SARS-CoV-2 RNA is generally detectable in upper and lower  respiratory specimens dur ing the acute phase of infection.  Positive  results are indicative of active infection with SARS-CoV-2.  Clinical  correlation with patient history and other diagnostic information is  necessary to determine patient infection status.  Positive results do  not rule out bacterial infection or co-infection with other viruses. If result is PRESUMPTIVE POSTIVE SARS-CoV-2  nucleic acids MAY BE PRESENT.   A presumptive positive result was obtained on the submitted specimen  and confirmed on repeat testing.  While 2019 novel coronavirus  (SARS-CoV-2) nucleic acids may be present in the submitted sample  additional confirmatory testing may be necessary for epidemiological  and / or clinical management purposes  to differentiate between  SARS-CoV-2 and other Sarbecovirus currently known to infect humans.  If clinically indicated additional testing with an alternate test  methodology [phone] is advised. The SARS-CoV-2 RNA is generally  detectable in upper and lower respiratory sp ecimens during the acute  phase of infection. The expected result is Negative. Fact Sheet for Patients:  BoilerBrush.com.cyhttps://www.fda.gov/media/136312/download Fact Sheet for Healthcare Providers: https://pope.com/https://www.fda.gov/media/136313/download This test is not yet approved or cleared by the [place] FDA and has been authorized for detection and/or diagnosis of SARS-CoV-2 by FDA under an Emergency Use Authorization (EUA).  This EUA will remain in effect (meaning this test can be used) for the duration of the COVID-19 declaration under Section 564(b)(1) of the Act, 21 U.S.C. section 360bbb-3(b)(1), unless the authorization is terminated or revoked sooner. Performed at [facility], [address] [place], [place] [address]   MRSA PCR Screening     Status: None   Collection Time: 11/03/18 11:39 AM   Specimen: Nasal Mucosa; Nasopharyngeal  Result Value Ref Range Status   MRSA by PCR NEGATIVE NEGATIVE Final    Comment:        The GeneXpert MRSA Assay (FDA approved for NASAL specimens only), is one component of a comprehensive  MRSA colonization surveillance program. It is not intended to diagnose MRSA infection nor to guide or monitor treatment for MRSA infections. Performed at [facility], [address] [place], [place] [address]     Radiology Reports Ct  Abdomen Pelvis W Contrast  Result Date: 11/02/2018 CLINICAL DATA:  Fever, lower abdominal pain EXAM: CT ABDOMEN AND PELVIS WITH CONTRAST TECHNIQUE: Multidetector CT imaging of the abdomen and pelvis was performed using the standard protocol following bolus administration of intravenous contrast. CONTRAST:  100mL OMNIPAQUE IOHEXOL 300 MG/ML  SOLN COMPARISON:  None. FINDINGS: Lower chest: Linear atelectasis or scarring in the lung bases. No acute abnormality. Coronary artery calcifications. Heart is normal size. Hepatobiliary: Diffuse low-density throughout the liver compatible with fatty infiltration. No focal abnormality. Gallbladder unremarkable. Pancreas: No focal abnormality or ductal dilatation. Spleen: No focal abnormality.  Normal size. Adrenals/Urinary Tract: No adrenal abnormality. No focal renal abnormality. No stones or hydronephrosis. Urinary bladder is unremarkable. Stomach/Bowel: Diffuse colonic diverticulosis, most pronounced in the sigmoid colon. There is inflammatory stranding around the sigmoid colon compatible with active diverticulitis. Stomach and small bowel decompressed, grossly unremarkable. Vascular/Lymphatic: Aortic atherosclerosis. No enlarged abdominal or pelvic lymph nodes. Reproductive: No visible focal abnormality. Other: No free fluid or free air. Musculoskeletal: No acute bony abnormality. IMPRESSION: Diffuse colonic diverticulosis. Inflammatory stranding around the sigmoid colon compatible with active diverticulitis. Mild diffuse fatty infiltration of the liver. Diffuse aortic atherosclerosis. Electronically Signed   By: Charlett NoseKevin  Dover M.D.   On: 11/02/2018 22:52   Dg Chest Port 1 View  Result Date: 11/02/2018 CLINICAL DATA:  Shortness of breath.  Fever. EXAM: PORTABLE CHEST 1 VIEW COMPARISON:  None. FINDINGS: Postsurgical changes are seen in the left lung. Mild haziness over the left mid lower lung is identified. The right lung is clear. No pneumothorax. The cardiomediastinal  silhouette is unremarkable. IMPRESSION: 1. Mild haziness over the left mid and lower chest could represent atelectasis or developing infiltrate. Recommend clinical correlation and attention on follow-up. 2. No other acute abnormalities. Electronically Signed   By: Gerome Samavid  Williams III M.D   On: 11/02/2018 20:58     CBC Recent Labs  Lab 11/02/18 2057 11/03/18 0535  WBC 14.8* 10.1  HGB 17.5* 17.0  HCT 50.9 51.3  PLT 146* 151  MCV 89.6 92.8  MCH 30.8 30.7  MCHC 34.4 33.1  RDW 14.0 14.1  LYMPHSABS 1.2  --   MONOABS 1.0  --   EOSABS 0.0  --   BASOSABS 0.1  --     Chemistries  Recent Labs  Lab 11/02/18 2057 11/03/18 0535  NA 131* 133*  K 3.6 4.2  CL 100 104  CO2 21* 19*  GLUCOSE 146* 172*  BUN 9 10  CREATININE 1.03 0.85  CALCIUM 8.5* 8.1*  AST 42*  --   ALT 40  --   ALKPHOS 78  --   BILITOT 1.4*  --    ------------------------------------------------------------------------------------------------------------------ estimated creatinine clearance is 110.5 mL/min (by C-G formula based on SCr of 0.85 mg/dL). ------------------------------------------------------------------------------------------------------------------ No results for input(s): HGBA1C in the last 72 hours. ------------------------------------------------------------------------------------------------------------------ No results for input(s): CHOL, HDL, LDLCALC, TRIG, CHOLHDL, LDLDIRECT in the last 72 hours. ------------------------------------------------------------------------------------------------------------------ No results for input(s): TSH, T4TOTAL, T3FREE, THYROIDAB in the last 72 hours.  Invalid input(s): FREET3 ------------------------------------------------------------------------------------------------------------------ No results for input(s): VITAMINB12, FOLATE, FERRITIN, TIBC, IRON, RETICCTPCT in the last 72 hours.  Coagulation profile Recent Labs  Lab 11/02/18 2057 11/03/18 0535   INR 1.1 1.1    No results for input(s):  DDIMER in the last 72 hours.  Cardiac Enzymes No results for input(s): CKMB, TROPONINI, MYOGLOBIN in the last 168 hours.  Invalid input(s): CK ------------------------------------------------------------------------------------------------------------------ Invalid input(s): POCBNP    Assessment & Plan   1.  Sepsis due to diverticulitis and pneumonia  continue Zosyn, discontinue IV vancomycin    2.  Diverticulitis Zosyn should cover most organisms  3.  Left lower lobe pneumonia - Continue Zosyn - DuoNebs as needed -O2 at 2 L per nasal cannula  4.  Acute exacerbation COPD - IV steroid therapy with Solu-Medrol every 6 hours - DuoNebs as needed -O2 at 2 L per nasal cannula will try to wean this as possible   5.  Nicotine abuse smoking cessation provided strongly recommend he stop smoking 4 minutes spent      Code Status Orders  (From admission, onward)         Start     Ordered   11/03/18 0140  Full code  Continuous     11/03/18 0139        Code Status History    This patient has a current code status but no historical code status.   Advance Care Planning Activity           Consults none  DVT Prophylaxis  Lovenox Lab Results  Component Value Date   PLT 151 11/03/2018     Time Spent in minutes (11am to 1145am) Greater than 50% of time spent in care coordination and counseling patient regarding the condition and plan of care.   Auburn Bilberry M.D on 11/03/2018 at 2:06 PM  Between 7am to 6pm - Pager - [phone]  After 6pm go to [email or web address] - Social research officer, government  [employer]   Office  [phone]

## 2018-11-03 NOTE — Progress Notes (Signed)
CODE SEPSIS - PHARMACY COMMUNICATION  **Broad Spectrum Antibiotics should be administered within 1 hour of Sepsis diagnosis**  Time Code Sepsis Called/Page Received: 2133  Antibiotics Ordered: vanc/cefepime/flagyl  Time of 1st antibiotic administration: 2134  Additional action taken by pharmacy:   If necessary, Name of Provider/Nurse Contacted:     Tobie Lords ,PharmD Clinical Pharmacist  11/03/2018  2:17 AM

## 2018-11-03 NOTE — Progress Notes (Signed)
Advanced care plan.  Purpose of the Encounter: CODE STATUS  Parties in Attendance: Patient himself  Patient's Decision Capacity: Intact  Subjective/Patient's story: Matthew Flowers  is a 68 y.o. male with a known history of COPD.  He presented to the emergency room via EMS services complaining of a 2-day history of generalized malaise with lower abdominal pain as well as subjective fevers and chills today.    Objective/Medical story I discussed with the patient regarding his desires for cardiac and pulmonary resuscitation.  Also discussed regarding his living will and healthcare power of attorney.   Goals of care determination:  Patient states that he wants to be a full code and would like everything to be done   CODE STATUS: Full code   Time spent discussing advanced care planning: 16 minutes
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
## 2018-11-03 NOTE — Progress Notes (Signed)
Pharmacy Antibiotic Note  Matthew Flowers is a 68 y.o. male admitted on 11/02/2018 with pneumonia.  Pharmacy has been consulted for vanc/zosyn dosing.  Plan: Patient received vanc 2.5g IV load  Vancomycin 1000 mg IV Q 12 hrs. Goal AUC 400-550. Expected AUC: 427.7 mcg*h/mL SCr used: 0.85 mg/dL Cssmin: 11.6 mcg/mL  Will continue zosyn 3.375g IV q8h will continue to monitor renal fx and s/sx of infx.  Height: 5\' 11"  (180.3 cm) Weight: 261 lb 3.2 oz (118.5 kg) IBW/kg (Calculated) : 75.3  Temp (24hrs), Avg:99.1 F (37.3 C), Min:97.6 F (36.4 C), Max:100.6 F (38.1 C)  Recent Labs  Lab 11/02/18 2057 11/03/18 0535  WBC 14.8* 10.1  CREATININE 1.03 0.85  LATICACIDVEN 1.3  --     Estimated Creatinine Clearance: 110.5 mL/min (by C-G formula based on SCr of 0.85 mg/dL).    No Known Allergies  Thank you for allowing pharmacy to be a part of this patient's care.  Tobie Lords, PharmD, BCPS Clinical Pharmacist 11/03/2018 7:03 AM
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
## 2018-11-04 LAB — BASIC METABOLIC PANEL
Anion gap: 9 (ref 5–15)
BUN: 21 mg/dL (ref 8–23)
CO2: 20 mmol/L — ABNORMAL LOW (ref 22–32)
Calcium: 8 mg/dL — ABNORMAL LOW (ref 8.9–10.3)
Chloride: 107 mmol/L (ref 98–111)
Creatinine, Ser: 0.94 mg/dL (ref 0.61–1.24)
GFR calc Af Amer: >60 mL/min (ref >60)
GFR calc non Af Amer: >60 mL/min (ref >60)
Glucose, Bld: 167 mg/dL — ABNORMAL HIGH (ref 70–99)
Potassium: 3.9 mmol/L (ref 3.5–5.1)
Sodium: 136 mmol/L (ref 135–145)

## 2018-11-04 LAB — CBC WITH DIFFERENTIAL/PLATELET
Abs Immature Granulocytes: 0.08 10*3/uL — ABNORMAL HIGH (ref 0.00–0.07)
Basophils Absolute: 0 10*3/uL (ref 0.0–0.1)
Basophils Relative: 0 %
Eosinophils Absolute: 0 10*3/uL (ref 0.0–0.5)
Eosinophils Relative: 0 %
HCT: 45.8 % (ref 39.0–52.0)
Hemoglobin: 15.3 g/dL (ref 13.0–17.0)
Immature Granulocytes: 1 %
Lymphocytes Relative: 12 %
Lymphs Abs: 1.6 10*3/uL (ref 0.7–4.0)
MCH: 30.5 pg (ref 26.0–34.0)
MCHC: 33.4 g/dL (ref 30.0–36.0)
MCV: 91.2 fL (ref 80.0–100.0)
Monocytes Absolute: 0.6 10*3/uL (ref 0.1–1.0)
Monocytes Relative: 4 %
Neutro Abs: 11.4 10*3/uL — ABNORMAL HIGH (ref 1.7–7.7)
Neutrophils Relative %: 83 %
Platelets: 157 10*3/uL (ref 150–400)
RBC: 5.02 MIL/uL (ref 4.22–5.81)
RDW: 14.4 % (ref 11.5–15.5)
WBC: 13.7 10*3/uL — ABNORMAL HIGH (ref 4.0–10.5)
nRBC: 0 % (ref 0.0–0.2)

## 2018-11-04 LAB — HIV ANTIBODY (ROUTINE TESTING W REFLEX): HIV Screen 4th Generation wRfx: NONREACTIVE

## 2018-11-04 MED ORDER — METHYLPREDNISOLONE SODIUM SUCC 40 MG IJ SOLR
40.0000 mg | Freq: Two times a day (BID) | INTRAMUSCULAR | Status: DC
  Administered 2018-11-04 – 2018-11-05 (×2): 40 mg via INTRAVENOUS
  Filled 2018-11-04 (×2): qty 1

## 2018-11-04 MED ORDER — OXYCODONE-ACETAMINOPHEN 5-325 MG PO TABS: 1.0000 | ORAL_TABLET | Freq: Three times a day (TID) | ORAL | Status: DC | PRN

## 2018-11-04 NOTE — Progress Notes (Signed)
[employer] - [place] [facility]                                                                                                                                                                                  Patient Demographics   Matthew Flowers, is a 68 y.o. male, DOB - 23-Dec-1950, ZOX:[id number]  Admit date - 11/02/2018   Admitting Physician Hannah Beat, MD  Outpatient Primary MD for the patient is Patient, No Pcp Per   LOS - 1  Subjective:  This is feeling better still having some abdominal pain   Review of Systems:   CONSTITUTIONAL: No documented fever. No fatigue, weakness. No weight gain, no weight loss.  EYES: No blurry or double vision.  ENT: No tinnitus. No postnasal drip. No redness of the oropharynx.  RESPIRATORY: No cough, no wheeze, no hemoptysis.  Positive dyspnea.  CARDIOVASCULAR: No chest pain. No orthopnea. No palpitations. No syncope.  GASTROINTESTINAL: No nausea, no vomiting or diarrhea.  Improved abdominal pain. No melena or hematochezia.  GENITOURINARY: No dysuria or hematuria.  ENDOCRINE: No polyuria or nocturia. No heat or cold intolerance.  HEMATOLOGY: No anemia. No bruising. No bleeding.  INTEGUMENTARY: No rashes. No lesions.  MUSCULOSKELETAL: No arthritis. No swelling. No gout.  NEUROLOGIC: No numbness, tingling, or ataxia. No seizure-type activity.  PSYCHIATRIC: No anxiety. No insomnia. No ADD.    Vitals:   Vitals:   11/03/18 1115 11/03/18 1609 11/03/18 1929 11/04/18 0802  BP:  140/85 138/73 134/66  Pulse:  74 71 63  Resp:  19 18 (!) 21  Temp:  97.9 F (36.6 C) (!) 97.5 F (36.4 C) (!) 97.5 F (36.4 C)  TempSrc:   Oral Oral  SpO2: 96% 97% 94% 96%  Weight:      Height:        Wt Readings from Last 3 Encounters:  11/03/18 118.5 kg  06/20/11 99.8 kg     Intake/Output Summary (Last 24 hours) at 11/04/2018 1213 Last data filed at 11/03/2018 1910 Gross per 24 hour  Intake 1631.92 ml  Output -  Net 1631.92 ml     Physical Exam:   GENERAL: Pleasant-appearing in no apparent distress.  HEAD, EYES, EARS, NOSE AND THROAT: Atraumatic, normocephalic. Extraocular muscles are intact. Pupils equal and reactive to light. Sclerae anicteric. No conjunctival injection. No oro-pharyngeal erythema.  NECK: Supple. There is no jugular venous distention. No bruits, no lymphadenopathy, no thyromegaly.  HEART: Regular rate and rhythm,. No murmurs, no rubs, no clicks.  LUNGS: Diminished breath sounds bilaterally ABDOMEN: Soft, flat, nontender, nondistended. Has good bowel sounds. No hepatosplenomegaly appreciated.  EXTREMITIES: No evidence of any cyanosis, clubbing, or peripheral edema.  +  2 pedal and radial pulses bilaterally.  NEUROLOGIC: The patient is alert, awake, and oriented x3 with no focal motor or sensory deficits appreciated bilaterally.  SKIN: Moist and warm with no rashes appreciated.  Psych: Not anxious, depressed LN: No inguinal LN enlargement    Antibiotics   Anti-infectives (From admission, onward)   Start     Dose/Rate Route Frequency Ordered Stop   11/03/18 1000  vancomycin (VANCOCIN) IVPB 1000 mg/200 mL premix  Status:  Discontinued     1,000 mg 200 mL/hr over 60 Minutes Intravenous Every 12 hours 11/03/18 0658 11/03/18 1348   11/03/18 0600  piperacillin-tazobactam (ZOSYN) IVPB 3.375 g  Status:  Discontinued     3.375 g 12.5 mL/hr over 240 Minutes Intravenous Every 6 hours 11/03/18 0419 11/03/18 0424   11/03/18 0500  cefTRIAXone (ROCEPHIN) 2 g in sodium chloride 0.9 % 100 mL IVPB  Status:  Discontinued     2 g 200 mL/hr over 30 Minutes Intravenous Every 24 hours 11/03/18 0139 11/03/18 0419   11/03/18 0430  piperacillin-tazobactam (ZOSYN) IVPB 3.375 g     3.375 g 12.5 mL/hr over 240 Minutes Intravenous Every 8 hours 11/03/18 0424     11/03/18 0315  azithromycin (ZITHROMAX) 500 mg in sodium chloride 0.9 % 250 mL IVPB  Status:  Discontinued     500 mg 250 mL/hr over 60 Minutes Intravenous  Every 24 hours 11/03/18 0139 11/03/18 0419   11/03/18 0315  metroNIDAZOLE (FLAGYL) IVPB 500 mg  Status:  Discontinued     500 mg 100 mL/hr over 60 Minutes Intravenous Every 8 hours 11/03/18 0139 11/03/18 0424   11/02/18 2130  vancomycin (VANCOCIN) 2,500 mg in sodium chloride 0.9 % 500 mL IVPB     2,500 mg 250 mL/hr over 120 Minutes Intravenous  Once 11/02/18 2127 11/03/18 0048   11/02/18 2130  ceFEPIme (MAXIPIME) 2 g in sodium chloride 0.9 % 100 mL IVPB     2 g 200 mL/hr over 30 Minutes Intravenous  Once 11/02/18 2128 11/02/18 2222      Medications   Scheduled Meds: . enoxaparin (LOVENOX) injection  40 mg Subcutaneous Q24H  . methylPREDNISolone (SOLU-MEDROL) injection  40 mg Intravenous Q12H  . multivitamin with minerals  1 tablet Oral Daily  . sodium chloride flush  3 mL Intravenous Q12H   Continuous Infusions: . piperacillin-tazobactam (ZOSYN)  IV 3.375 g (11/04/18 0655)   PRN Meds:.acetaminophen **OR** acetaminophen, ipratropium-albuterol, ondansetron **OR** ondansetron (ZOFRAN) IV, oxyCODONE-acetaminophen   Data Review:   Micro Results Recent Results (from the past 240 hour(s))  Blood Culture (routine x 2)     Status: None (Preliminary result)   Collection Time: 11/02/18  8:57 PM   Specimen: BLOOD  Result Value Ref Range Status   Specimen Description BLOOD BLOOD RIGHT HAND  Final   Special Requests   Final    BOTTLES DRAWN AEROBIC AND ANAEROBIC Blood Culture adequate volume   Culture   Final    NO GROWTH 2 DAYS Performed at [facility], [address] [place], [place] [address]    Report Status PENDING  Incomplete  Blood Culture (routine x 2)     Status: None (Preliminary result)   Collection Time: 11/02/18  8:57 PM   Specimen: BLOOD  Result Value Ref Range Status   Specimen Description BLOOD RIGHT ANTECUBITAL  Final   Special Requests   Final    BOTTLES DRAWN AEROBIC AND ANAEROBIC Blood Culture results may not be optimal due to an excessive volume of  blood received in culture bottles   Culture   Final    NO GROWTH 2 DAYS Performed at [facility], [address] [place], [place] [address]    Report Status PENDING  Incomplete  SARS Coronavirus 2 (CEPHEID- Performed in [facility]), Hosp Order     Status: None   Collection Time: 11/02/18  8:57 PM   Specimen: Nasopharyngeal Swab  Result Value Ref Range Status   SARS Coronavirus 2 NEGATIVE NEGATIVE Final    Comment: (NOTE) If result is NEGATIVE SARS-CoV-2 target nucleic acids are NOT DETECTED. The SARS-CoV-2 RNA is generally detectable in upper and lower  respiratory specimens during the acute phase of infection. The lowest  concentration of SARS-CoV-2 viral copies this assay can detect is 250  copies / mL. A negative result does not preclude SARS-CoV-2 infection  and should not be used as the sole basis for treatment or other  patient management decisions.  A negative result may occur with  improper specimen collection / handling, submission of specimen other  than nasopharyngeal swab, presence of viral mutation(s) within the  areas targeted by this assay, and inadequate number of viral copies  (<250 copies / mL). A negative result must be combined with clinical  observations, patient history, and epidemiological information. If result is POSITIVE SARS-CoV-2 target nucleic acids are DETECTED. The SARS-CoV-2 RNA is generally detectable in upper and lower  respiratory specimens dur ing the acute phase of infection.  Positive  results are indicative of active infection with SARS-CoV-2.  Clinical  correlation with patient history and other diagnostic information is  necessary to determine patient infection status.  Positive results do  not rule out bacterial infection or co-infection with other viruses. If result is PRESUMPTIVE POSTIVE SARS-CoV-2 nucleic acids MAY BE PRESENT.   A presumptive positive result was obtained on the submitted specimen  and  confirmed on repeat testing.  While 2019 novel coronavirus  (SARS-CoV-2) nucleic acids may be present in the submitted sample  additional confirmatory testing may be necessary for epidemiological  and / or clinical management purposes  to differentiate between  SARS-CoV-2 and other Sarbecovirus currently known to infect humans.  If clinically indicated additional testing with an alternate test  methodology [phone]([id number]) is advised. The SARS-CoV-2 RNA is generally  detectable in upper and lower respiratory sp ecimens during the acute  phase of infection. The expected result is Negative. Fact Sheet for Patients:  BoilerBrush.com.cyhttps://www.fda.gov/media/136312/download Fact Sheet for Healthcare Providers: https://pope.com/https://www.fda.gov/media/136313/download This test is not yet approved or cleared by the [place] FDA and has been authorized for detection and/or diagnosis of SARS-CoV-2 by FDA under an Emergency Use Authorization (EUA).  This EUA will remain in effect (meaning this test can be used) for the duration of the COVID-19 declaration under Section 564(b)(1) of the Act, 21 U.S.C. section 360bbb-3(b)(1), unless the authorization is terminated or revoked sooner. Performed at [facility], [address] [place], [place] [address]   MRSA PCR Screening     Status: None   Collection Time: 11/03/18 11:39 AM   Specimen: Nasal Mucosa; Nasopharyngeal  Result Value Ref Range Status   MRSA by PCR NEGATIVE NEGATIVE Final    Comment:        The GeneXpert MRSA Assay (FDA approved for NASAL specimens only), is one component of a comprehensive MRSA colonization surveillance program. It is not intended to diagnose MRSA infection nor to guide or monitor treatment for MRSA infections. Performed at [facility], [address]  [address], [place], [place] [address]     Radiology Reports Ct Abdomen Pelvis W Contrast  Result Date: 11/02/2018 CLINICAL DATA:  Fever, lower abdominal pain EXAM: CT ABDOMEN  AND PELVIS WITH CONTRAST TECHNIQUE: Multidetector CT imaging of the abdomen and pelvis was performed using the standard protocol following bolus administration of intravenous contrast. CONTRAST:  144mL OMNIPAQUE IOHEXOL 300 MG/ML  SOLN COMPARISON:  None. FINDINGS: Lower chest: Linear atelectasis or scarring in the lung bases. No acute abnormality. Coronary artery calcifications. Heart is normal size. Hepatobiliary: Diffuse low-density throughout the liver compatible with fatty infiltration. No focal abnormality. Gallbladder unremarkable. Pancreas: No focal abnormality or ductal dilatation. Spleen: No focal abnormality.  Normal size. Adrenals/Urinary Tract: No adrenal abnormality. No focal renal abnormality. No stones or hydronephrosis. Urinary bladder is unremarkable. Stomach/Bowel: Diffuse colonic diverticulosis, most pronounced in the sigmoid colon. There is inflammatory stranding around the sigmoid colon compatible with active diverticulitis. Stomach and small bowel decompressed, grossly unremarkable. Vascular/Lymphatic: Aortic atherosclerosis. No enlarged abdominal or pelvic lymph nodes. Reproductive: No visible focal abnormality. Other: No free fluid or free air. Musculoskeletal: No acute bony abnormality. IMPRESSION: Diffuse colonic diverticulosis. Inflammatory stranding around the sigmoid colon compatible with active diverticulitis. Mild diffuse fatty infiltration of the liver. Diffuse aortic atherosclerosis. Electronically Signed   By: Rolm Baptise M.D.   On: 11/02/2018 22:52   Dg Chest Port 1 View  Result Date: 11/02/2018 CLINICAL DATA:  Shortness of breath.  Fever. EXAM: PORTABLE CHEST 1 VIEW COMPARISON:  None. FINDINGS: Postsurgical changes are seen in the left lung. Mild haziness over the left mid lower lung is identified. The right lung is clear. No pneumothorax. The cardiomediastinal silhouette is unremarkable. IMPRESSION: 1. Mild haziness over the left mid and lower chest could represent  atelectasis or developing infiltrate. Recommend clinical correlation and attention on follow-up. 2. No other acute abnormalities. Electronically Signed   By: Dorise Bullion III M.D   On: 11/02/2018 20:58     CBC Recent Labs  Lab 11/02/18 2057 11/03/18 0535 11/04/18 0859  WBC 14.8* 10.1 13.7*  HGB 17.5* 17.0 15.3  HCT 50.9 51.3 45.8  PLT 146* 151 157  MCV 89.6 92.8 91.2  MCH 30.8 30.7 30.5  MCHC 34.4 33.1 33.4  RDW 14.0 14.1 14.4  LYMPHSABS 1.2  --  1.6  MONOABS 1.0  --  0.6  EOSABS 0.0  --  0.0  BASOSABS 0.1  --  0.0    Chemistries  Recent Labs  Lab 11/02/18 2057 11/03/18 0535 11/04/18 0859  NA 131* 133* 136  K 3.6 4.2 3.9  CL 100 104 107  CO2 21* 19* 20*  GLUCOSE 146* 172* 167*  BUN 9 10 21   CREATININE 1.03 0.85 0.94  CALCIUM 8.5* 8.1* 8.0*  AST 42*  --   --   ALT 40  --   --   ALKPHOS 78  --   --   BILITOT 1.4*  --   --    ------------------------------------------------------------------------------------------------------------------ estimated creatinine clearance is 99.9 mL/min (by C-G formula based on SCr of 0.94 mg/dL). ------------------------------------------------------------------------------------------------------------------ No results for input(s): HGBA1C in the last 72 hours. ------------------------------------------------------------------------------------------------------------------ No results for input(s): CHOL, HDL, LDLCALC, TRIG, CHOLHDL, LDLDIRECT in the last 72 hours. ------------------------------------------------------------------------------------------------------------------ No results for input(s): TSH, T4TOTAL, T3FREE, THYROIDAB in the last 72 hours.  Invalid input(s): FREET3 ------------------------------------------------------------------------------------------------------------------ No results for input(s): VITAMINB12, FOLATE, FERRITIN, TIBC, IRON, RETICCTPCT in the last 72 hours.  Coagulation profile Recent Labs  Lab  11/02/18 2057 11/03/18 0535  INR 1.1  1.1    No results for input(s): DDIMER in the last 72 hours.  Cardiac Enzymes No results for input(s): CKMB, TROPONINI, MYOGLOBIN in the last 168 hours.  Invalid input(s): CK ------------------------------------------------------------------------------------------------------------------ Invalid input(s): POCBNP    Assessment & Plan   1.  Sepsis due to diverticulitis and pneumonia  continue Zosyn,  Switch to oral antibiotics tomorrow Stop IV fluids   2.  Diverticulitis Continue Zosyn change to Augmentin tomorrow  3.  Left lower lobe pneumonia - Continue Zosyn - DuoNebs as needed -Wean off oxygen  4.  Acute exacerbation COPD -  Change Solu-Medrol to every 8 hours - DuoNebs as needed -O2 at 2 L per nasal cannula will try to wean this as possible   5.  Nicotine abuse smoking cessation provided strongly recommend he stop smoking 4 minutes spent      Code Status Orders  (From admission, onward)         Start     Ordered   11/03/18 0140  Full code  Continuous     11/03/18 0139        Code Status History    This patient has a current code status but no historical code status.   Advance Care Planning Activity           Consults none  DVT Prophylaxis  Lovenox Lab Results  Component Value Date   PLT 157 11/04/2018     Time Spent in minutes 45min (11am to 1145am) Greater than 50% of time spent in care coordination and counseling patient regarding the condition and plan of care.   Auburn BilberryShreyang Wilson Sample M.D on 11/04/2018 at 12:13 PM  Between 7am to 6pm - Pager - [phone]  After 6pm go to [email or web address] - Social research officer, governmentpassword [email or web address] [email or web address]  [employer]   Office  [phone]

## 2018-11-04 NOTE — Progress Notes (Signed)
RN called RT to patient bedside for assessment of breathing treatment. Patient experiencing some shortness of breath with exertion and excessive talking, but does not appear to be in distress. Patient SAT on room air is 96%. Diminished bilateral breath sounds noted at this time.  Will continue to monitor.

## 2018-11-05 MED ORDER — ACETAMINOPHEN 325 MG PO TABS: 650.0000 mg | ORAL_TABLET | Freq: Four times a day (QID) | ORAL | Status: AC | PRN

## 2018-11-05 MED ORDER — PREDNISONE 10 MG (21) PO TBPK: ORAL_TABLET | ORAL | 0 refills | Status: AC

## 2018-11-05 MED ORDER — AMOXICILLIN-POT CLAVULANATE 875-125 MG PO TABS: 1.0000 | ORAL_TABLET | Freq: Two times a day (BID) | ORAL | 0 refills | Status: AC

## 2018-11-05 MED ORDER — IPRATROPIUM-ALBUTEROL 0.5-2.5 (3) MG/3ML IN SOLN: 3.0000 mL | Freq: Four times a day (QID) | RESPIRATORY_TRACT | 0 refills | Status: AC | PRN

## 2018-11-05 MED ORDER — FLUTICASONE-SALMETEROL 250-50 MCG/DOSE IN AEPB: 1.0000 | INHALATION_SPRAY | Freq: Two times a day (BID) | RESPIRATORY_TRACT | 11 refills | Status: AC

## 2018-11-05 NOTE — Progress Notes (Signed)
Patient discharged to home with nebulizer.  Tele and I/V d/c'd prior to discharge. Patient verbalizes understanding of discharge instructions. Patient with all belongings upon discharge.

## 2018-11-05 NOTE — TOC Transition Note (Signed)
Transition of Care [facility]) - CM/SW Discharge Note   Patient Details  Name: Matthew Flowers MRN: [id number] Date of Birth: Jan 19, 1951  [facility] CM/SW Contact:  Latanya Maudlin, RN Phone Number: 11/05/2018, 12:19 PM   Clinical Narrative: Patient with DME orders for nebulizer. Obtained from [employer]. Brought to bedside.            Patient Goals and CMS Choice        Discharge Placement                       Discharge Plan and Services                DME Arranged: Nebulizer machine DME Agency: [employer] Date DME Agency Contacted: 11/05/18 Time DME Agency Contacted: 1219 Representative spoke with at DME Agency: [place] ([place]) Interventions     Readmission Risk Interventions No flowsheet data found.

## 2018-11-05 NOTE — Discharge Summary (Signed)
[facility] [place] [facility]  [place] J Antrobus, 68 y.o., DOB 11/13/1950, MRN [id number]. Admission date: 11/02/2018 Discharge Date 11/05/2018 Primary MD Patient, No Pcp Per Admitting Physician Hannah BeatJan A Mansy, MD  Admission Diagnosis  Diverticulitis [K57.92] Moderate persistent reactive airway disease with acute exacerbation [J45.41] Community acquired pneumonia of left lung, unspecified part of lung [J18.9] Sepsis, due to unspecified organism, unspecified whether acute organ dysfunction present [facility](HCC) [A41.9]  Discharge Diagnosis   Active Problems: Sepsis due to acute diverticulitis and community-acquired pneumonia Acute exacerbation of COPD Morbid obesity Nicotine abuse   Hospital Course  Geroge BasemanRichard Schmelzle  is a 68 y.o. male with a known history of COPD.  He presented to the emergency room via EMS services complaining of a 2-day history of generalized malaise with lower abdominal pain as well as subjective fevers and chills today.  He has noted shortness of breath as well as nonproductive cough.  He also endorses nausea however no vomiting.  Patient has experienced diarrhea.  He denies chest pain.  Lower abdominal pain is described as sharp and aching pain with a pain score 5-8 out of 10.  CT abdomen demonstrates diffuse colonic diverticulosis with acute diverticulitis.  Patient was admitted started on treatment with antibiotics and treatment for COPD exacerbation.  Patient was treated with nebs steroids with significant improvement in symptoms.  Patient is doing much better.  He will have a nebulizer to take home with him.  I have strongly recommended he stop smoking        Consults  None  Significant Tests:  See full reports for all details     Ct Abdomen Pelvis W Contrast  Result Date: 11/02/2018 CLINICAL DATA:  Fever, lower abdominal pain EXAM: CT ABDOMEN AND PELVIS WITH CONTRAST TECHNIQUE: Multidetector CT imaging of the abdomen and pelvis was performed using  the standard protocol following bolus administration of intravenous contrast. CONTRAST:  100mL OMNIPAQUE IOHEXOL 300 MG/ML  SOLN COMPARISON:  None. FINDINGS: Lower chest: Linear atelectasis or scarring in the lung bases. No acute abnormality. Coronary artery calcifications. Heart is normal size. Hepatobiliary: Diffuse low-density throughout the liver compatible with fatty infiltration. No focal abnormality. Gallbladder unremarkable. Pancreas: No focal abnormality or ductal dilatation. Spleen: No focal abnormality.  Normal size. Adrenals/Urinary Tract: No adrenal abnormality. No focal renal abnormality. No stones or hydronephrosis. Urinary bladder is unremarkable. Stomach/Bowel: Diffuse colonic diverticulosis, most pronounced in the sigmoid colon. There is inflammatory stranding around the sigmoid colon compatible with active diverticulitis. Stomach and small bowel decompressed, grossly unremarkable. Vascular/Lymphatic: Aortic atherosclerosis. No enlarged abdominal or pelvic lymph nodes. Reproductive: No visible focal abnormality. Other: No free fluid or free air. Musculoskeletal: No acute bony abnormality. IMPRESSION: Diffuse colonic diverticulosis. Inflammatory stranding around the sigmoid colon compatible with active diverticulitis. Mild diffuse fatty infiltration of the liver. Diffuse aortic atherosclerosis. Electronically Signed   By: Charlett NoseKevin  Dover M.D.   On: 11/02/2018 22:52   Dg Chest Port 1 View  Result Date: 11/02/2018 CLINICAL DATA:  Shortness of breath.  Fever. EXAM: PORTABLE CHEST 1 VIEW COMPARISON:  None. FINDINGS: Postsurgical changes are seen in the left lung. Mild haziness over the left mid lower lung is identified. The right lung is clear. No pneumothorax. The cardiomediastinal silhouette is unremarkable. IMPRESSION: 1. Mild haziness over the left mid and lower chest could represent atelectasis or developing infiltrate. Recommend clinical correlation and attention on follow-up. 2. No other acute  abnormalities. Electronically Signed   By: Gerome Samavid  Williams III M.D   On:  11/02/2018 20:58       Today   Subjective:   Geroge Basemanichard Gilman patient doing well denies any complaints o Objective:   Blood pressure 125/64, pulse (!) 58, temperature 97.7 F (36.5 C), temperature source Oral, resp. rate 19, height 5\' 11"  (1.803 m), weight 120.6 kg, SpO2 98 %.  .  Intake/Output Summary (Last 24 hours) at 11/05/2018 1240 Last data filed at 11/05/2018 0441 Gross per 24 hour  Intake 240 ml  Output 0 ml  Net 240 ml    Exam VITAL SIGNS: Blood pressure 125/64, pulse (!) 58, temperature 97.7 F (36.5 C), temperature source Oral, resp. rate 19, height 5\' 11"  (1.803 m), weight 120.6 kg, SpO2 98 %.  GENERAL:  68 y.o.-year-old patient lying in the bed with no acute distress.  EYES: Pupils equal, round, reactive to light and accommodation. No scleral icterus. Extraocular muscles intact.  HEENT: Head atraumatic, normocephalic. Oropharynx and nasopharynx clear.  NECK:  Supple, no jugular venous distention. No thyroid enlargement, no tenderness.  LUNGS: Normal breath sounds bilaterally, no wheezing, rales,rhonchi or crepitation. No use of accessory muscles of respiration.  CARDIOVASCULAR: S1, S2 normal. No murmurs, rubs, or gallops.  ABDOMEN: Soft, nontender, nondistended. Bowel sounds present. No organomegaly or mass.  EXTREMITIES: No pedal edema, cyanosis, or clubbing.  NEUROLOGIC: Cranial nerves II through XII are intact. Muscle strength 5/5 in all extremities. Sensation intact. Gait not checked.  PSYCHIATRIC: The patient is alert and oriented x 3.  SKIN: No obvious rash, lesion, or ulcer.   Data Review     CBC w Diff:  Lab Results  Component Value Date   WBC 13.7 (H) 11/04/2018   HGB 15.3 11/04/2018   HCT 45.8 11/04/2018   PLT 157 11/04/2018   LYMPHOPCT 12 11/04/2018   MONOPCT 4 11/04/2018   EOSPCT 0 11/04/2018   BASOPCT 0 11/04/2018   CMP:  Lab Results  Component Value Date   NA 136  11/04/2018   K 3.9 11/04/2018   CL 107 11/04/2018   CO2 20 (L) 11/04/2018   BUN 21 11/04/2018   CREATININE 0.94 11/04/2018   PROT 7.2 11/02/2018   ALBUMIN 3.7 11/02/2018   BILITOT 1.4 (H) 11/02/2018   ALKPHOS 78 11/02/2018   AST 42 (H) 11/02/2018   ALT 40 11/02/2018  .  Micro Results Recent Results (from the past 240 hour(s))  Blood Culture (routine x 2)     Status: None (Preliminary result)   Collection Time: 11/02/18  8:57 PM   Specimen: BLOOD  Result Value Ref Range Status   Specimen Description BLOOD BLOOD RIGHT HAND  Final   Special Requests   Final    BOTTLES DRAWN AEROBIC AND ANAEROBIC Blood Culture adequate volume   Culture   Final    NO GROWTH 3 DAYS Performed at [facility], [address] [place], [place] [address]    Report Status PENDING  Incomplete  Blood Culture (routine x 2)     Status: None (Preliminary result)   Collection Time: 11/02/18  8:57 PM   Specimen: BLOOD  Result Value Ref Range Status   Specimen Description BLOOD RIGHT ANTECUBITAL  Final   Special Requests   Final    BOTTLES DRAWN AEROBIC AND ANAEROBIC Blood Culture results may not be optimal due to an excessive volume of blood received in culture bottles   Culture   Final    NO GROWTH 3 DAYS Performed at [facility], [address] [place], [place] [address]  Report Status PENDING  Incomplete  SARS Coronavirus 2 (CEPHEID- Performed in [place] hospital lab), Hosp Order     Status: None   Collection Time: 11/02/18  8:57 PM   Specimen: Nasopharyngeal Swab  Result Value Ref Range Status   SARS Coronavirus 2 NEGATIVE NEGATIVE Final    Comment: (NOTE) If result is NEGATIVE SARS-CoV-2 target nucleic acids are NOT DETECTED. The SARS-CoV-2 RNA is generally detectable in upper and lower  respiratory specimens during the acute phase of infection. The lowest  concentration of SARS-CoV-2 viral copies this assay can detect is 250  copies / mL. A negative result does not  preclude SARS-CoV-2 infection  and should not be used as the sole basis for treatment or other  patient management decisions.  A negative result may occur with  improper specimen collection / handling, submission of specimen other  than nasopharyngeal swab, presence of viral mutation(s) within the  areas targeted by this assay, and inadequate number of viral copies  (<250 copies / mL). A negative result must be combined with clinical  observations, patient history, and epidemiological information. If result is POSITIVE SARS-CoV-2 target nucleic acids are DETECTED. The SARS-CoV-2 RNA is generally detectable in upper and lower  respiratory specimens dur ing the acute phase of infection.  Positive  results are indicative of active infection with SARS-CoV-2.  Clinical  correlation with patient history and other diagnostic information is  necessary to determine patient infection status.  Positive results do  not rule out bacterial infection or co-infection with other viruses. If result is PRESUMPTIVE POSTIVE SARS-CoV-2 nucleic acids MAY BE PRESENT.   A presumptive positive result was obtained on the submitted specimen  and confirmed on repeat testing.  While 2019 novel coronavirus  (SARS-CoV-2) nucleic acids may be present in the submitted sample  additional confirmatory testing may be necessary for epidemiological  and / or clinical management purposes  to differentiate between  SARS-CoV-2 and other Sarbecovirus currently known to infect humans.  If clinically indicated additional testing with an alternate test  methodology [phone]) is advised. The SARS-CoV-2 RNA is generally  detectable in upper and lower respiratory sp ecimens during the acute  phase of infection. The expected result is Negative. Fact Sheet for Patients:  StrictlyIdeas.no Fact Sheet for Healthcare Providers: BankingDealers.co.za This test is not yet approved or cleared by  the [place] FDA and has been authorized for detection and/or diagnosis of SARS-CoV-2 by FDA under an Emergency Use Authorization (EUA).  This EUA will remain in effect (meaning this test can be used) for the duration of the COVID-19 declaration under Section 564(b)(1) of the Act, 21 U.S.C. section 360bbb-3(b)(1), unless the authorization is terminated or revoked sooner. Performed at [facility], [address] [place], [place] [address]   MRSA PCR Screening     Status: None   Collection Time: 11/03/18 11:39 AM   Specimen: Nasal Mucosa; Nasopharyngeal  Result Value Ref Range Status   MRSA by PCR NEGATIVE NEGATIVE Final    Comment:        The GeneXpert MRSA Assay (FDA approved for NASAL specimens only), is one component of a comprehensive MRSA colonization surveillance program. It is not intended to diagnose MRSA infection nor to guide or monitor treatment for MRSA infections. Performed at [facility], [address], [place], [place] [address]         Code Status Orders  (From admission, onward)         Start  Ordered   11/03/18 0140  Full code  Continuous     11/03/18 0139        Code Status History    This patient has a current code status but no historical code status.   Advance Care Planning Activity            Discharge Medications   Allergies as of 11/05/2018   No Known Allergies     Medication List    TAKE these medications   acetaminophen 325 MG tablet Commonly known as: TYLENOL Take 2 tablets (650 mg total) by mouth every 6 (six) hours as needed for mild pain (or Fever >/= 101).   amoxicillin-clavulanate 875-125 MG tablet Commonly known as: Augmentin Take 1 tablet by mouth 2 (two) times daily for 5 days.   Fluticasone-Salmeterol 250-50 MCG/DOSE Aepb Commonly known as: Advair Diskus Inhale 1 puff into the lungs 2 (two) times daily.   ipratropium-albuterol 0.5-2.5 (3) MG/3ML Soln Commonly known as:  DUONEB Take 3 mLs by nebulization every 6 (six) hours as needed.   predniSONE 10 MG (21) Tbpk tablet Commonly known as: STERAPRED UNI-PAK 21 TAB Start at 60mg  taper by 10mg  until complete            Durable Medical Equipment  (From admission, onward)         Start     Ordered   11/05/18 0948  For home use only DME Nebulizer machine  Once    Question Answer Comment  Patient needs a nebulizer to treat with the following condition COPD (chronic obstructive pulmonary disease) (HCC)   Length of Need Lifetime      11/05/18 0947             Total Time in preparing paper work, data evaluation and todays exam - 35 minutes  Auburn BilberryShreyang Keenon Leitzel M.D on 11/05/2018 at 12:40 PM [facility]   Office  [phone]

## 2018-11-05 NOTE — Plan of Care (Signed)
?  Problem: Respiratory: ?Goal: Ability to maintain adequate ventilation will improve ?Outcome: Progressing ?  ?Problem: Respiratory: ?Goal: Ability to maintain a clear airway will improve ?Outcome: Progressing ?  ?

## 2018-11-06 DIAGNOSIS — J449 Chronic obstructive pulmonary disease, unspecified: Secondary | ICD-10-CM | POA: Diagnosis not present

## 2018-11-07 LAB — CULTURE, BLOOD (ROUTINE X 2)
Culture: NO GROWTH
Culture: NO GROWTH
Special Requests: ADEQUATE

## 2018-11-07 LAB — ROCKY MTN SPOTTED FVR ABS PNL(IGG+IGM)
RMSF IgG: NEGATIVE
RMSF IgM: 0.46 index (ref 0.00–0.89)

## 2018-11-08 ENCOUNTER — Other Ambulatory Visit: Payer: Self-pay | Admitting: *Deleted
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
## 2018-11-09 ENCOUNTER — Other Ambulatory Visit: Payer: Self-pay

## 2018-11-09 ENCOUNTER — Other Ambulatory Visit: Payer: Self-pay | Admitting: *Deleted

## 2018-11-09 ENCOUNTER — Encounter: Payer: Self-pay | Admitting: *Deleted

## 2018-11-09 NOTE — Patient Outreach (Signed)
[facility]) Care Management  11/09/2018  Matthew Flowers [birth year] [id number]   Care coordination  [facility] RN CM returned a call to Mr Derflinger He was able to verify HIPAA [facility] RN CM reviewed a list of in network humana in network providers using his address He appreciates that the list was sent to him to assist with a choice of pcp if he remains in [employer] [employer]. He states he will updated CM prn He was given CM availability plus the contact information for the 24 hour nurse call center He was encouraged to call with any changes in his medical issues He reports he is now up walking and is feeling better since the last call He voices appreciation for the call from [place] RN CM answered questions about pnuemonia  CM discussed EMMI materials to be received via mail    Plan  Case closure   Holcomb L. Lavina Hamman, RN, BSN, [place] Coordinator Office number [phone] Mobile number [phone]  Main [facility] number [phone] Fax number [phone]
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
## 2018-11-13 ENCOUNTER — Other Ambulatory Visit: Payer: Self-pay | Admitting: *Deleted

## 2018-11-13 NOTE — Patient Outreach (Addendum)
[facility] [facility]) Care Management  11/13/2018  JYDEN KROMER 1950-12-04 [id number]   Subjective: Telephone call to patient's home number, spoke with patient, and HIPAA verified.  Discussed [facility] Care Management [employer] HMIO EMMI General Discharge Red Alert Flag follow up, patient voiced understanding, and is in agreement to follow up.  Patient states he is doing fine, skipping right along, taking medications as prescribed, states he has no follow up scheduled at this time because he will be moving to [place] on 11/20/2018, does not want to establish care in [place] and in [place], moving to be closer to family, and is planning to establish care with a primary MD in [place].  Patient states he is aware of how to access list of primary MD's through his insurance company and will follow up as soon as possible upon his relocation. States he remember received EMMI automated calls, speaking with another RNCM last week regarding EMMI Red Flag Alerts, and EMMI follow ups completed at that time.  Discussed importance of hospital follow up with primary MD, patient voices understanding, and states he will follow up as appropriate.  Patient states she is aware of signs/ symptoms to report, when to go to ED, and / or call 911.  Patient states he does not have any education material, EMMI follow up, care coordination, care management, disease monitoring, transportation, community resource, or pharmacy needs at this time. States he is very appreciative of the follow up and is in agreement to receive [place] [facility] EMMI follow up calls as needed.    Objective: Per KPN (Knowledge Performance Now, point of care tool) and chart review, patient hospitalized 11/02/2018 -11/05/2018 for sepsis secondary to acute diverticulitis, COPD exacerbation, and  community-acquired pneumonia.    Patient has a history of nicotine abuse, having recently stopped smoking per chart review.    Assessment: Tomma Rakers [facility] EMMI General Discharge  Red Flag Alert follow up referral on 11/13/2018.  Red Flag Alert Trigger, Day #4, patient answered no to the following question: Scheduled follow-up?   EMMI follow up completed and no further care management needs.      Plan: RNCM will complete case closure due to follow up completed / no care management needs.      Quana Chamberlain H. Annia Friendly, BSN, [place] [facility] Telephonic CM Phone: [phone] Fax: [phone]

## 2018-12-07 DIAGNOSIS — J449 Chronic obstructive pulmonary disease, unspecified: Secondary | ICD-10-CM | POA: Diagnosis not present

## 2018-12-11 ENCOUNTER — Other Ambulatory Visit: Payer: Self-pay | Admitting: *Deleted
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
## 2019-01-07 DIAGNOSIS — J449 Chronic obstructive pulmonary disease, unspecified: Secondary | ICD-10-CM | POA: Diagnosis not present

## 2019-02-06 DIAGNOSIS — J449 Chronic obstructive pulmonary disease, unspecified: Secondary | ICD-10-CM | POA: Diagnosis not present

## 2019-03-09 DIAGNOSIS — J449 Chronic obstructive pulmonary disease, unspecified: Secondary | ICD-10-CM | POA: Diagnosis not present

## 2019-04-04 ENCOUNTER — Other Ambulatory Visit: Payer: Self-pay | Admitting: *Deleted

## 2019-04-04 NOTE — Patient Outreach (Signed)
[facility]) Care Management  04/04/2019  Matthew Flowers [birth year] [id number]   Care coordination- call from pt    [place] CM received a voice message from [name] on 04/04/19 that was not understandable [facility] RN CM returned a call to him  [name] verified HIPAA 9 DOB, Address in [place])  [name] inquired about covid vaccine administration and transportation to get to a place for a covid vaccine (not covid testing). He voiced wondering if "cone had a list to get the shot for this virus" [facility] RN CM assessed to see if he had received the previous [facility] RN CM letter sent to him by [facility] RN CM in July 2020 providing him with a list of in network [employer] primary care providers and Building surveyor for [employer]. He confirms he received but did not read the complete letter.  [facility] RN CM recommended [name] establish care with one of the in network [employer] providers and contact [employer] for transportation to scheduled appointment. [facility] RN CM discussed not being aware of covid vaccine for patient availability at this time nor a list for [place] regional for covid administration. [facility] RN CM offered the contact numbers to [employer], [place] [facility] and to the previous  list of in network [employer] primary care providers but he informed [facility] RN CM he would review the previous letter sent to him He reports his son "moved to [employer] L. Lavina Hamman, RN, BSN, [place] Coordinator Office number [phone] Mobile number [phone]  Main [facility] number [phone] Fax number [phone]

## 2019-04-08 DIAGNOSIS — J449 Chronic obstructive pulmonary disease, unspecified: Secondary | ICD-10-CM | POA: Diagnosis not present

## 2019-05-09 DIAGNOSIS — J449 Chronic obstructive pulmonary disease, unspecified: Secondary | ICD-10-CM | POA: Diagnosis not present

## 2019-06-09 DIAGNOSIS — J449 Chronic obstructive pulmonary disease, unspecified: Secondary | ICD-10-CM | POA: Diagnosis not present

## 2019-07-07 DIAGNOSIS — J449 Chronic obstructive pulmonary disease, unspecified: Secondary | ICD-10-CM | POA: Diagnosis not present

## 2019-08-07 DIAGNOSIS — J449 Chronic obstructive pulmonary disease, unspecified: Secondary | ICD-10-CM | POA: Diagnosis not present

## 2019-08-22 DIAGNOSIS — H02831 Dermatochalasis of right upper eyelid: Secondary | ICD-10-CM | POA: Diagnosis not present

## 2019-08-22 DIAGNOSIS — H11153 Pinguecula, bilateral: Secondary | ICD-10-CM | POA: Diagnosis not present

## 2019-08-22 DIAGNOSIS — H02834 Dermatochalasis of left upper eyelid: Secondary | ICD-10-CM | POA: Diagnosis not present

## 2019-08-22 DIAGNOSIS — H35361 Drusen (degenerative) of macula, right eye: Secondary | ICD-10-CM | POA: Diagnosis not present

## 2019-08-22 DIAGNOSIS — H40011 Open angle with borderline findings, low risk, right eye: Secondary | ICD-10-CM | POA: Diagnosis not present

## 2019-08-22 DIAGNOSIS — H2522 Age-related cataract, morgagnian type, left eye: Secondary | ICD-10-CM | POA: Diagnosis not present

## 2019-08-22 DIAGNOSIS — Z961 Presence of intraocular lens: Secondary | ICD-10-CM | POA: Diagnosis not present

## 2019-08-22 DIAGNOSIS — H35721 Serous detachment of retinal pigment epithelium, right eye: Secondary | ICD-10-CM | POA: Diagnosis not present

## 2019-09-06 DIAGNOSIS — J449 Chronic obstructive pulmonary disease, unspecified: Secondary | ICD-10-CM | POA: Diagnosis not present

## 2019-10-07 DIAGNOSIS — J449 Chronic obstructive pulmonary disease, unspecified: Secondary | ICD-10-CM | POA: Diagnosis not present

## 2019-10-12 DIAGNOSIS — H18413 Arcus senilis, bilateral: Secondary | ICD-10-CM | POA: Diagnosis not present

## 2019-10-12 DIAGNOSIS — Z961 Presence of intraocular lens: Secondary | ICD-10-CM | POA: Diagnosis not present

## 2019-10-12 DIAGNOSIS — H2512 Age-related nuclear cataract, left eye: Secondary | ICD-10-CM | POA: Diagnosis not present

## 2019-10-12 DIAGNOSIS — H25042 Posterior subcapsular polar age-related cataract, left eye: Secondary | ICD-10-CM | POA: Diagnosis not present

## 2019-10-12 DIAGNOSIS — H25012 Cortical age-related cataract, left eye: Secondary | ICD-10-CM | POA: Diagnosis not present

## 2019-10-22 DIAGNOSIS — H2512 Age-related nuclear cataract, left eye: Secondary | ICD-10-CM | POA: Diagnosis not present

## 2019-10-22 DIAGNOSIS — H278 Other specified disorders of lens: Secondary | ICD-10-CM | POA: Diagnosis not present

## 2019-10-23 ENCOUNTER — Encounter (INDEPENDENT_AMBULATORY_CARE_PROVIDER_SITE_OTHER): Payer: Self-pay | Admitting: Ophthalmology

## 2019-10-23 ENCOUNTER — Ambulatory Visit (INDEPENDENT_AMBULATORY_CARE_PROVIDER_SITE_OTHER): Payer: Medicare HMO | Admitting: Ophthalmology

## 2019-10-23 ENCOUNTER — Other Ambulatory Visit: Payer: Self-pay

## 2019-10-23 DIAGNOSIS — H59022 Cataract (lens) fragments in eye following cataract surgery, left eye: Secondary | ICD-10-CM | POA: Insufficient documentation

## 2019-10-23 DIAGNOSIS — H2702 Aphakia, left eye: Secondary | ICD-10-CM | POA: Diagnosis not present

## 2019-10-23 DIAGNOSIS — H353112 Nonexudative age-related macular degeneration, right eye, intermediate dry stage: Secondary | ICD-10-CM

## 2019-10-23 HISTORY — DX: Aphakia, left eye: H27.02

## 2019-10-23 NOTE — Assessment & Plan Note (Signed)
The nature of retained lens fragment in the vitreous was discussed with the patient. I discussed the structure  that normally  holds that cataract lens in place is similar to a "baggy", and its weakness or disruption in the process of cataract surgery leads to dispersal of lens fragments into the vitreous (gelatin like) material behind the planned lens implant.   The need for vitrectomy with removal of lens fragments and possible placement of a secondary intraocular lens implant discussed.  Large cataract lens fragments may trigger inflammation which can impact the internal structures of the eye and vision.  Some instances require repositioning of initial intraocular lens implant, or possibly the removal or exchange of the initial lens implant, and replacement with a different style of lens implant that is suitable for the new eye condition.  The occurrence of lens fragments retained after cataract surgery does have attendant risk of retinal tears and retinal detachments. These issues may be addressed in the office or operating room upon discovery during the initial surgery or recovery period.  OS, will need vitrectomy to remove retained lens fragments all of these are small there are nonetheless present with a large cortical fragment inferiorly.  At the same time secondary insertion of posterior chamber intraocular lens using the Zeiss CT [place] 602 lens with Yamani scleral tunnel fixation technique OS.
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
## 2019-10-31 ENCOUNTER — Encounter (INDEPENDENT_AMBULATORY_CARE_PROVIDER_SITE_OTHER): Payer: Medicare HMO | Admitting: Ophthalmology

## 2019-10-31 DIAGNOSIS — H2702 Aphakia, left eye: Secondary | ICD-10-CM

## 2019-10-31 DIAGNOSIS — H59022 Cataract (lens) fragments in eye following cataract surgery, left eye: Secondary | ICD-10-CM

## 2019-11-01 ENCOUNTER — Other Ambulatory Visit: Payer: Self-pay

## 2019-11-01 ENCOUNTER — Ambulatory Visit (INDEPENDENT_AMBULATORY_CARE_PROVIDER_SITE_OTHER): Payer: Medicare HMO | Admitting: Ophthalmology

## 2019-11-01 ENCOUNTER — Encounter (INDEPENDENT_AMBULATORY_CARE_PROVIDER_SITE_OTHER): Payer: Self-pay | Admitting: Ophthalmology

## 2019-11-01 ENCOUNTER — Encounter (INDEPENDENT_AMBULATORY_CARE_PROVIDER_SITE_OTHER): Payer: Medicare HMO | Admitting: Ophthalmology

## 2019-11-01 DIAGNOSIS — H59022 Cataract (lens) fragments in eye following cataract surgery, left eye: Secondary | ICD-10-CM

## 2019-11-01 DIAGNOSIS — H2702 Aphakia, left eye: Secondary | ICD-10-CM

## 2019-11-01 DIAGNOSIS — Z09 Encounter for follow-up examination after completed treatment for conditions other than malignant neoplasm: Secondary | ICD-10-CM | POA: Insufficient documentation

## 2019-11-01 NOTE — Assessment & Plan Note (Signed)
Postoperative day #1 vitrectomy OS

## 2019-11-01 NOTE — Assessment & Plan Note (Signed)
Status post vitrectomy and insertion of Zeiss CT [place] IOL 3 piece via Yamani scleral technique
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
## 2019-11-06 DIAGNOSIS — J449 Chronic obstructive pulmonary disease, unspecified: Secondary | ICD-10-CM | POA: Diagnosis not present

## 2019-11-08 ENCOUNTER — Other Ambulatory Visit: Payer: Self-pay

## 2019-11-08 ENCOUNTER — Encounter (INDEPENDENT_AMBULATORY_CARE_PROVIDER_SITE_OTHER): Payer: Self-pay | Admitting: Ophthalmology

## 2019-11-08 ENCOUNTER — Ambulatory Visit (INDEPENDENT_AMBULATORY_CARE_PROVIDER_SITE_OTHER): Payer: Medicare HMO | Admitting: Ophthalmology

## 2019-11-08 DIAGNOSIS — H2702 Aphakia, left eye: Secondary | ICD-10-CM

## 2019-11-08 DIAGNOSIS — Z09 Encounter for follow-up examination after completed treatment for conditions other than malignant neoplasm: Secondary | ICD-10-CM

## 2019-11-08 DIAGNOSIS — H59022 Cataract (lens) fragments in eye following cataract surgery, left eye: Secondary | ICD-10-CM

## 2019-11-08 NOTE — Assessment & Plan Note (Signed)
OS, Zeiss 602 lens, suspended via Yamani scleral tunnel fixation. This slightly decentered nasally yet satisfactory for functioning. Obvious tilt. Slight standoff posteriorly from the iris with low chance of pupillary occlusion.  May have a mild residual myopia with the left eye, will need refraction
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
## 2019-11-29 ENCOUNTER — Encounter (INDEPENDENT_AMBULATORY_CARE_PROVIDER_SITE_OTHER): Payer: Medicare HMO | Admitting: Ophthalmology

## 2020-02-25 ENCOUNTER — Ambulatory Visit (INDEPENDENT_AMBULATORY_CARE_PROVIDER_SITE_OTHER): Payer: Medicare HMO | Admitting: Ophthalmology

## 2020-02-25 ENCOUNTER — Encounter (INDEPENDENT_AMBULATORY_CARE_PROVIDER_SITE_OTHER): Payer: Self-pay | Admitting: Ophthalmology

## 2020-02-25 ENCOUNTER — Other Ambulatory Visit: Payer: Self-pay

## 2020-02-25 DIAGNOSIS — H35352 Cystoid macular degeneration, left eye: Secondary | ICD-10-CM | POA: Diagnosis not present

## 2020-02-25 DIAGNOSIS — H59022 Cataract (lens) fragments in eye following cataract surgery, left eye: Secondary | ICD-10-CM | POA: Diagnosis not present

## 2020-02-25 DIAGNOSIS — H2702 Aphakia, left eye: Secondary | ICD-10-CM

## 2020-02-25 MED ORDER — KETOROLAC TROMETHAMINE 0.5 % OP SOLN: 1.0000 [drp] | Freq: Four times a day (QID) | OPHTHALMIC | 0 refills | Status: AC
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
## 2020-02-25 NOTE — Assessment & Plan Note (Signed)
OS condition resolved °

## 2020-02-25 NOTE — Assessment & Plan Note (Signed)
Minor diffuse cystoid macular edema with no signs of epiretinal membrane.  We will treat this empirically left eye with topical NSAIDs, ketorolac initially 4 times a day.

## 2020-04-07 ENCOUNTER — Encounter (INDEPENDENT_AMBULATORY_CARE_PROVIDER_SITE_OTHER): Payer: Medicare HMO | Admitting: Ophthalmology

## 2021-03-24 IMAGING — DX PORTABLE CHEST - 1 VIEW
1 series · 1 of 1 positions shown · non-contrast
Comparison: None.

CLINICAL DATA: Shortness of breath.  Fever.

EXAM:
PORTABLE CHEST 1 VIEW

[chest ap]
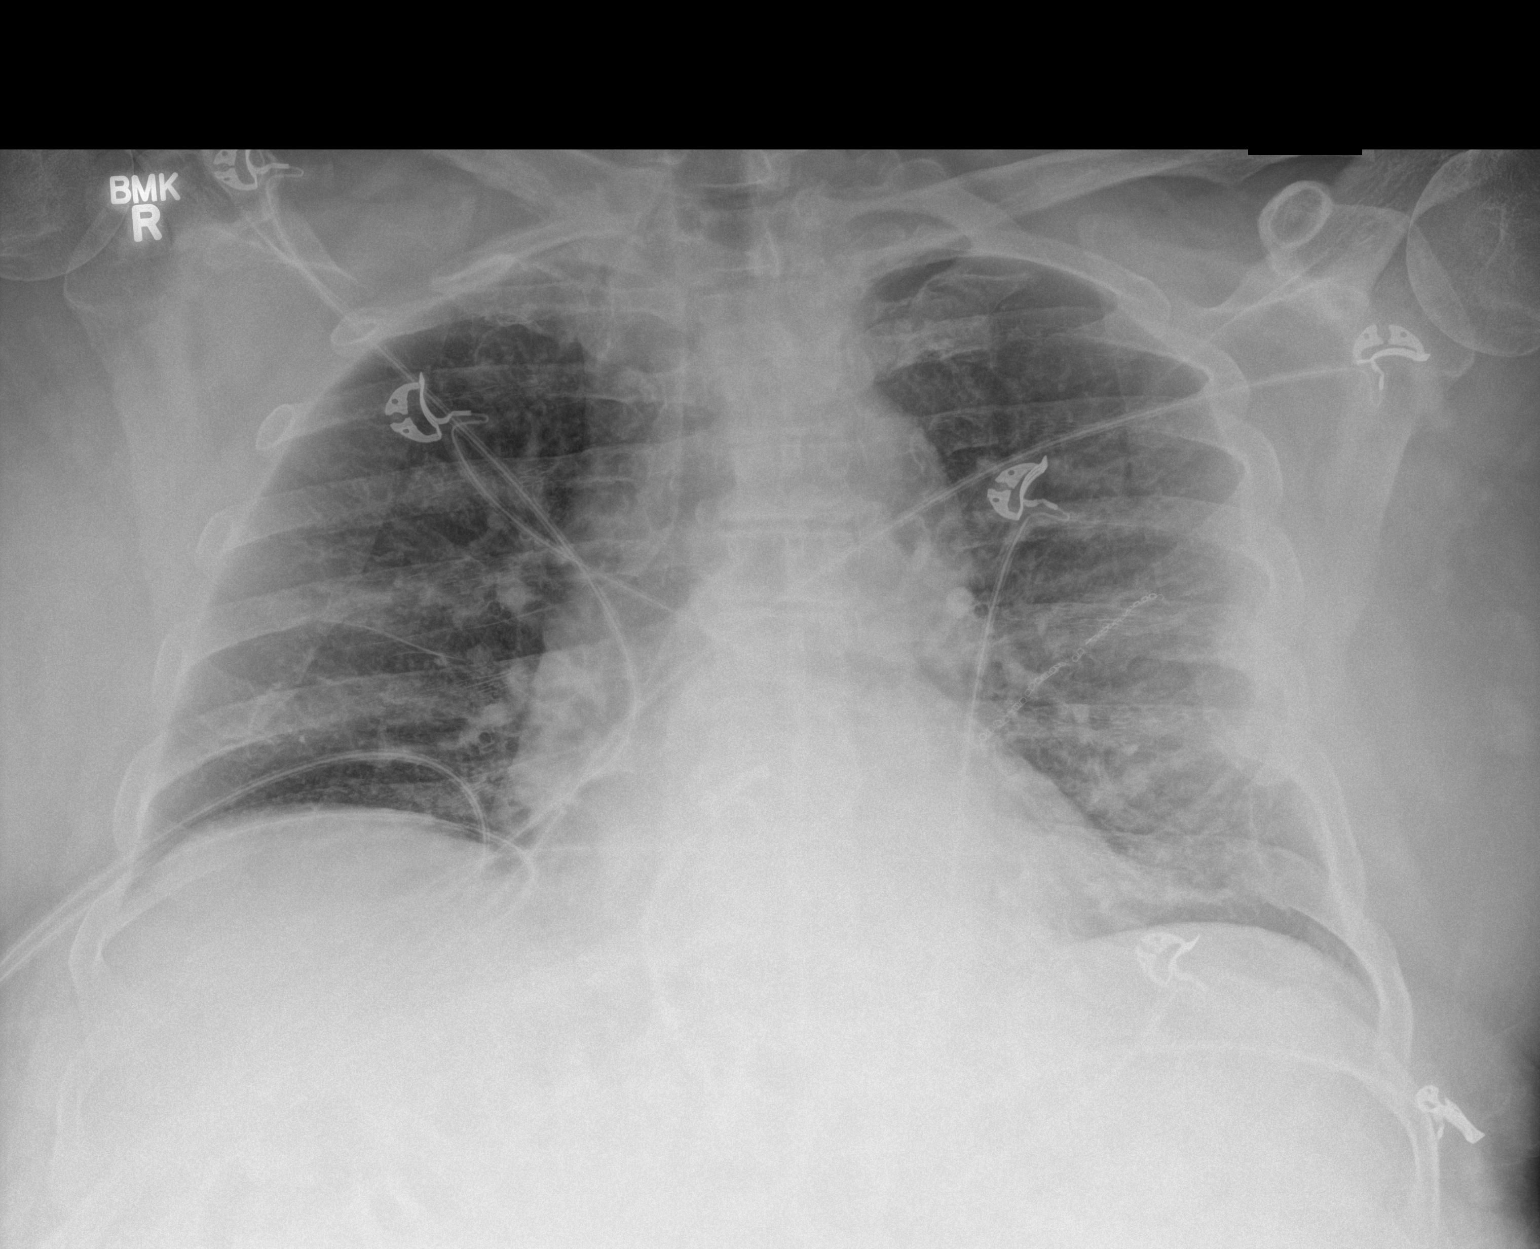

[1 of 1 positions shown; findings below may reference images not displayed]

FINDINGS: Postsurgical changes are seen in the left lung. Mild haziness over
the left mid lower lung is identified. The right lung is clear. No
pneumothorax. The cardiomediastinal silhouette is unremarkable.
IMPRESSION: 1. Mild haziness over the left mid and lower chest could represent
atelectasis or developing infiltrate. Recommend clinical correlation
and attention on follow-up.
2. No other acute abnormalities.
